# Patient Record
Sex: Female | Born: 1945 | Race: White | Hispanic: No | Marital: Married | Smoking: Former smoker
Health system: Southern US, Community
[De-identification: ages and names within clinical notes are randomized; demographics above are authoritative.]

## PROBLEM LIST (undated history)

## (undated) DIAGNOSIS — Z860100 Personal history of colon polyps, unspecified: Secondary | ICD-10-CM

## (undated) DIAGNOSIS — Z923 Personal history of irradiation: Secondary | ICD-10-CM

## (undated) DIAGNOSIS — E079 Disorder of thyroid, unspecified: Secondary | ICD-10-CM

## (undated) DIAGNOSIS — T7840XA Allergy, unspecified, initial encounter: Secondary | ICD-10-CM

## (undated) DIAGNOSIS — Z1211 Encounter for screening for malignant neoplasm of colon: Secondary | ICD-10-CM

## (undated) DIAGNOSIS — Z87891 Personal history of nicotine dependence: Secondary | ICD-10-CM

## (undated) DIAGNOSIS — K6289 Other specified diseases of anus and rectum: Secondary | ICD-10-CM

## (undated) DIAGNOSIS — E039 Hypothyroidism, unspecified: Secondary | ICD-10-CM

## (undated) DIAGNOSIS — C801 Malignant (primary) neoplasm, unspecified: Secondary | ICD-10-CM

## (undated) DIAGNOSIS — K219 Gastro-esophageal reflux disease without esophagitis: Secondary | ICD-10-CM

## (undated) DIAGNOSIS — I10 Essential (primary) hypertension: Secondary | ICD-10-CM

## (undated) DIAGNOSIS — B029 Zoster without complications: Secondary | ICD-10-CM

## (undated) DIAGNOSIS — F419 Anxiety disorder, unspecified: Secondary | ICD-10-CM

## (undated) DIAGNOSIS — E785 Hyperlipidemia, unspecified: Secondary | ICD-10-CM

## (undated) HISTORY — DX: Allergy, unspecified, initial encounter: T78.40XA

## (undated) HISTORY — DX: Other specified diseases of anus and rectum: K62.89

## (undated) HISTORY — DX: Essential (primary) hypertension: I10

## (undated) HISTORY — DX: Hyperlipidemia, unspecified: E78.5

## (undated) HISTORY — DX: Personal history of nicotine dependence: Z87.891

## (undated) HISTORY — DX: Disorder of thyroid, unspecified: E07.9

## (undated) HISTORY — DX: Encounter for screening for malignant neoplasm of colon: Z12.11

## (undated) HISTORY — DX: Zoster without complications: B02.9

## (undated) HISTORY — DX: Personal history of colonic polyps: Z86.010

## (undated) HISTORY — DX: Malignant (primary) neoplasm, unspecified: C80.1

## (undated) HISTORY — PX: TUBAL LIGATION: SHX77

## (undated) HISTORY — DX: Personal history of colon polyps, unspecified: Z86.0100

## (undated) HISTORY — PX: CATARACT EXTRACTION: SUR2

## (undated) HISTORY — PX: BREAST RECONSTRUCTION: SHX9

---

## 1985-06-07 DIAGNOSIS — C50919 Malignant neoplasm of unspecified site of unspecified female breast: Secondary | ICD-10-CM

## 1985-06-07 DIAGNOSIS — C801 Malignant (primary) neoplasm, unspecified: Secondary | ICD-10-CM

## 1985-06-07 HISTORY — DX: Malignant (primary) neoplasm, unspecified: C80.1

## 1985-06-07 HISTORY — PX: MASTECTOMY: SHX3

## 1985-06-07 HISTORY — PX: BREAST SURGERY: SHX581

## 1985-06-07 HISTORY — DX: Malignant neoplasm of unspecified site of unspecified female breast: C50.919

## 1986-06-07 HISTORY — PX: BREAST BIOPSY: SHX20

## 1986-06-07 HISTORY — PX: BREAST EXCISIONAL BIOPSY: SUR124

## 2003-06-08 DIAGNOSIS — E079 Disorder of thyroid, unspecified: Secondary | ICD-10-CM

## 2003-06-08 HISTORY — DX: Disorder of thyroid, unspecified: E07.9

## 2004-04-08 ENCOUNTER — Ambulatory Visit: Payer: Self-pay | Admitting: General Surgery

## 2005-04-14 ENCOUNTER — Ambulatory Visit: Payer: Self-pay | Admitting: General Surgery

## 2006-04-08 ENCOUNTER — Ambulatory Visit: Payer: Self-pay | Admitting: General Surgery

## 2006-06-07 DIAGNOSIS — K6289 Other specified diseases of anus and rectum: Secondary | ICD-10-CM

## 2006-06-07 HISTORY — DX: Other specified diseases of anus and rectum: K62.89

## 2007-04-14 ENCOUNTER — Ambulatory Visit: Payer: Self-pay | Admitting: General Surgery

## 2007-05-22 DIAGNOSIS — K6289 Other specified diseases of anus and rectum: Secondary | ICD-10-CM | POA: Insufficient documentation

## 2007-06-07 ENCOUNTER — Ambulatory Visit: Payer: Self-pay | Admitting: General Surgery

## 2008-04-10 ENCOUNTER — Ambulatory Visit: Payer: Self-pay | Admitting: General Surgery

## 2009-04-11 ENCOUNTER — Ambulatory Visit: Payer: Self-pay | Admitting: General Surgery

## 2010-04-13 ENCOUNTER — Ambulatory Visit: Payer: Self-pay | Admitting: General Surgery

## 2010-06-07 DIAGNOSIS — B029 Zoster without complications: Secondary | ICD-10-CM

## 2010-06-07 HISTORY — PX: COLONOSCOPY: SHX174

## 2010-06-07 HISTORY — DX: Zoster without complications: B02.9

## 2011-04-15 ENCOUNTER — Ambulatory Visit: Payer: Self-pay | Admitting: General Surgery

## 2011-10-13 ENCOUNTER — Ambulatory Visit: Payer: Self-pay | Admitting: General Surgery

## 2012-05-01 ENCOUNTER — Ambulatory Visit: Payer: Self-pay | Admitting: General Surgery

## 2012-11-27 ENCOUNTER — Encounter: Payer: Self-pay | Admitting: *Deleted

## 2012-11-27 DIAGNOSIS — E079 Disorder of thyroid, unspecified: Secondary | ICD-10-CM | POA: Insufficient documentation

## 2012-11-27 DIAGNOSIS — C801 Malignant (primary) neoplasm, unspecified: Secondary | ICD-10-CM | POA: Insufficient documentation

## 2013-05-10 ENCOUNTER — Ambulatory Visit: Payer: Self-pay | Admitting: General Surgery

## 2013-05-11 ENCOUNTER — Encounter: Payer: Self-pay | Admitting: General Surgery

## 2013-05-21 ENCOUNTER — Encounter: Payer: Self-pay | Admitting: General Surgery

## 2013-05-21 ENCOUNTER — Ambulatory Visit (INDEPENDENT_AMBULATORY_CARE_PROVIDER_SITE_OTHER): Payer: Medicare Other | Admitting: General Surgery

## 2013-05-21 VITALS — BP 150/70 | HR 90 | Resp 16 | Ht 61.0 in | Wt 154.0 lb

## 2013-05-21 DIAGNOSIS — Z853 Personal history of malignant neoplasm of breast: Secondary | ICD-10-CM

## 2013-05-21 DIAGNOSIS — K6289 Other specified diseases of anus and rectum: Secondary | ICD-10-CM

## 2013-05-21 DIAGNOSIS — Z1239 Encounter for other screening for malignant neoplasm of breast: Secondary | ICD-10-CM

## 2013-05-21 NOTE — Patient Instructions (Signed)
Patient to return in one year right screening  mammogram

## 2013-05-21 NOTE — Progress Notes (Signed)
Patient ID: Erin Nolan, female   DOB: Feb 21, 1946, 67 y.o.   MRN: [id number]  Chief Complaint  Patient presents with  . Follow-up    mammogram    HPI Erin Nolan is a 67 y.o. female who presents for a breast evaluation. The most recent right breast  mammogram was done on 05/09/13. Patient does perform regular self breast checks and gets regular mammograms done.    HPI  Past Medical History  Diagnosis Date  . Allergy   . Hypertension   . Personal history of tobacco use, presenting hazards to health   . Thyroid disease 2005    hypothyroidism  . Shingles 2012  . Special screening for malignant neoplasms, colon   . Proctitis 2008  . Hyperlipidemia   . Personal history of colonic polyps   . Cancer 1987    left, diagnosed in July 1987    Past Surgical History  Procedure Laterality Date  . Colonoscopy  2012    hx of colon polyps, Dr. Lemar Livings  . Tubal ligation    . Breast surgery Left 1987    mastectomy    Family History  Problem Relation Age of Onset  . Cancer Other     family member with breast cancer    Social History History  Substance Use Topics  . Smoking status: Former Smoker -- 1.00 packs/day for 20 years    Types: Cigarettes  . Smokeless tobacco: Never Used  . Alcohol Use: No    Allergies  Allergen Reactions  . Neosporin [Neomycin-Bacitracin Zn-Polymyx] Itching    Current Outpatient Prescriptions  Medication Sig Dispense Refill  . amLODipine (NORVASC) 10 MG tablet Take 1 tablet by mouth daily.      Marland Kitchen aspirin 81 MG tablet Take 81 mg by mouth daily.      . calcium carbonate (OS-CAL) 600 MG TABS tablet Take 600 mg by mouth 2 (two) times daily with a meal.      . cholecalciferol (VITAMIN D) 400 UNITS TABS tablet Take 400 Units by mouth.      Marland Kitchen KLOR-CON M20 20 MEQ tablet Take 1 tablet by mouth daily.      Marland Kitchen levothyroxine (SYNTHROID, LEVOTHROID) 50 MCG tablet Take 1 tablet by mouth daily at 6 (six) AM.      . pantoprazole (PROTONIX) 40 MG tablet  Take 1 tablet by mouth daily.      . simvastatin (ZOCOR) 20 MG tablet Take 1 tablet by mouth daily.       No current facility-administered medications for this visit.    Review of Systems Review of Systems  Constitutional: Negative.   Respiratory: Negative.   Cardiovascular: Negative.     Blood pressure 150/70, pulse 90, resp. rate 16, height 5\' 1"  (1.549 m), weight 154 lb (69.854 kg).  Physical Exam Physical Exam  Constitutional: She is oriented to person, place, and time. She appears well-developed and well-nourished.  Eyes: No scleral icterus.  Cardiovascular: Normal rate, regular rhythm and normal heart sounds.   Pulmonary/Chest: Breath sounds normal. Right breast exhibits no inverted nipple, no mass, no nipple discharge, no skin change and no tenderness. Breasts are asymmetrical (mild asymmetry secondary due reconstruction, mild capsular contracture on the left.).  Left reconstructionbreast well built.  Lymphadenopathy:    She has no cervical adenopathy.    She has no axillary adenopathy.  Neurological: She is alert and oriented to person, place, and time.  Skin: Skin is warm and dry.    Data Reviewed Right breast  mammogram dated May 10, 2013 was reviewed. No interval change. BI-RAD-1.  Assessment    Benign right breast exam.  No recurrent proctitis.     Plan    The patient desires annual follow up in this office. Repeat examination with a right breast mammogram will be obtained in one year.        Erin Nolan 05/21/2013, 7:50 PM

## 2013-10-26 ENCOUNTER — Ambulatory Visit: Payer: Self-pay | Admitting: Gastroenterology

## 2014-04-08 ENCOUNTER — Encounter: Payer: Self-pay | Admitting: General Surgery

## 2014-05-13 ENCOUNTER — Encounter: Payer: Self-pay | Admitting: General Surgery

## 2014-05-13 ENCOUNTER — Ambulatory Visit: Payer: Self-pay | Admitting: General Surgery

## 2014-05-23 ENCOUNTER — Ambulatory Visit (INDEPENDENT_AMBULATORY_CARE_PROVIDER_SITE_OTHER): Payer: Medicare Other | Admitting: General Surgery

## 2014-05-23 ENCOUNTER — Encounter: Payer: Self-pay | Admitting: General Surgery

## 2014-05-23 VITALS — BP 122/68 | HR 70 | Resp 12 | Ht 62.0 in | Wt 144.0 lb

## 2014-05-23 DIAGNOSIS — Z853 Personal history of malignant neoplasm of breast: Secondary | ICD-10-CM

## 2014-05-23 DIAGNOSIS — Z8719 Personal history of other diseases of the digestive system: Secondary | ICD-10-CM

## 2014-05-23 NOTE — Patient Instructions (Signed)
Patient will be asked to return to the office in one year with a right screening mammogram. 

## 2014-05-23 NOTE — Progress Notes (Signed)
Patient ID: Erin Nolan, female   DOB: Nov 18, 1945, 68 y.o.   MRN: [id number]  Chief Complaint  Patient presents with  . Follow-up    mammogram     HPI Erin Nolan is a 68 y.o. female who presents for a breast evaluation. The most recent mammogram was done on 05/13/14. Patient does perform regular self breast checks and gets regular mammograms done. She denies any problems at this time.   The patient reports no rectal bleeding.     HPI  Past Medical History  Diagnosis Date  . Allergy   . Hypertension   . Personal history of tobacco use, presenting hazards to health   . Thyroid disease 2005    hypothyroidism  . Shingles 2012  . Special screening for malignant neoplasms, colon   . Proctitis 2008  . Hyperlipidemia   . Personal history of colonic polyps   . Cancer 1987    left, diagnosed in July 1987    Past Surgical History  Procedure Laterality Date  . Colonoscopy  2012    hx of colon polyps, Dr. Bary Castilla  . Tubal ligation    . Breast surgery Left 1987    mastectomy  . Breast reconstruction Left     Family History  Problem Relation Age of Onset  . Cancer Other     family member with breast cancer    Social History History  Substance Use Topics  . Smoking status: Former Smoker -- 1.00 packs/day for 20 years    Types: Cigarettes  . Smokeless tobacco: Never Used  . Alcohol Use: No    Allergies  Allergen Reactions  . Neosporin [Neomycin-Bacitracin Zn-Polymyx] Itching    Current Outpatient Prescriptions  Medication Sig Dispense Refill  . amLODipine (NORVASC) 10 MG tablet Take 1 tablet by mouth daily.    Marland Kitchen aspirin 81 MG tablet Take 81 mg by mouth daily.    . calcium carbonate (OS-CAL) 600 MG TABS tablet Take 600 mg by mouth 2 (two) times daily with a meal.    . KLOR-CON M20 20 MEQ tablet Take 1 tablet by mouth daily.    Marland Kitchen levothyroxine (SYNTHROID, LEVOTHROID) 50 MCG tablet Take 1 tablet by mouth daily at 6 (six) AM.    . pantoprazole (PROTONIX) 40  MG tablet Take 1 tablet by mouth daily.    . simvastatin (ZOCOR) 20 MG tablet Take 1 tablet by mouth daily.    Marland Kitchen triamterene-hydrochlorothiazide (MAXZIDE) 75-50 MG per tablet     . cholecalciferol (VITAMIN D) 400 UNITS TABS tablet Take 400 Units by mouth.     No current facility-administered medications for this visit.    Review of Systems Review of Systems  Constitutional: Negative.   Respiratory: Negative.   Cardiovascular: Negative.     Blood pressure 122/68, pulse 70, resp. rate 12, height 5\' 2"  (1.575 m), weight 144 lb (65.318 kg).  Physical Exam Physical Exam  Constitutional: She appears well-developed and well-nourished.  Eyes: Conjunctivae are normal. No scleral icterus.  Neck: Neck supple.  Cardiovascular: Normal rate, regular rhythm and normal heart sounds.   Pulmonary/Chest: Effort normal and breath sounds normal. Right breast exhibits no inverted nipple, no mass, no nipple discharge, no skin change and no tenderness.  left breast reconstruction is good   Abdominal: Soft. Bowel sounds are normal. There is no tenderness.  Lymphadenopathy:    She has no cervical adenopathy.    She has no axillary adenopathy.  Neurological: She is alert.  Skin: Skin is warm  and dry.    Data Reviewed Right breast tomo synthesis mammogram dated 05/13/2014 was negative. BI-RADS-1.  Assessment    Benign breast exam. No clinical history to suggest recurrent proctitis.    Plan    Patient will be asked to return to the office in one year with a right  screening mammogram.    PCP:  Erin Nolan 05/25/2014, 8:08 AM

## 2014-05-25 DIAGNOSIS — Z853 Personal history of malignant neoplasm of breast: Secondary | ICD-10-CM | POA: Insufficient documentation

## 2014-05-25 DIAGNOSIS — Z8719 Personal history of other diseases of the digestive system: Secondary | ICD-10-CM | POA: Insufficient documentation

## 2015-03-28 ENCOUNTER — Other Ambulatory Visit: Payer: Self-pay

## 2015-03-28 DIAGNOSIS — Z1231 Encounter for screening mammogram for malignant neoplasm of breast: Secondary | ICD-10-CM

## 2015-05-15 ENCOUNTER — Ambulatory Visit
Admission: RE | Admit: 2015-05-15 | Discharge: 2015-05-15 | Disposition: A | Discharge status: Home / Self Care | Payer: Medicare Other | Source: Ambulatory Visit | Attending: General Surgery | Admitting: General Surgery

## 2015-05-15 ENCOUNTER — Other Ambulatory Visit: Payer: Self-pay | Admitting: General Surgery

## 2015-05-15 DIAGNOSIS — Z1231 Encounter for screening mammogram for malignant neoplasm of breast: Secondary | ICD-10-CM | POA: Diagnosis not present

## 2015-05-27 ENCOUNTER — Encounter: Payer: Self-pay | Admitting: General Surgery

## 2015-05-27 ENCOUNTER — Ambulatory Visit (INDEPENDENT_AMBULATORY_CARE_PROVIDER_SITE_OTHER): Payer: Medicare Other | Admitting: General Surgery

## 2015-05-27 VITALS — BP 142/64 | HR 86 | Resp 14 | Ht 62.0 in | Wt 150.0 lb

## 2015-05-27 DIAGNOSIS — Z853 Personal history of malignant neoplasm of breast: Secondary | ICD-10-CM

## 2015-05-27 NOTE — Progress Notes (Signed)
Patient ID: [name], female   DOB: 1946-03-16, 69 y.o.   MRN: [id number]  Chief Complaint  Patient presents with  . Follow-up    mammogram    HPI Erin Nolan is a 69 y.o. female who presents for her follow up breast cancer and a breast evaluation. The most recent mammogram was done on 05/15/15.Marland Kitchen  Patient does perform regular self breast checks and gets regular mammograms done.   No new breast issues. Denies rectal bleeding or pain. Occasional constipation which resolves with the ingestion of 1-2 prunes. I personally reviewed the patient's history. HPI  Past Medical History  Diagnosis Date  . Allergy   . Hypertension   . Personal history of tobacco use, presenting hazards to health   . Thyroid disease 2005    hypothyroidism  . Shingles 2012  . Special screening for malignant neoplasms, colon   . Proctitis 2008  . Hyperlipidemia   . Personal history of colonic polyps   . Cancer [facility]) 1987    left, diagnosed in July 1987    Past Surgical History  Procedure Laterality Date  . Colonoscopy  2012    hx of colon polyps, Dr. Bary Castilla  . Tubal ligation    . Breast surgery Left 1987    mastectomy  . Breast reconstruction Left   . Mastectomy Left 1987    breast ca  . Breast biopsy Right 1987    excisional - neg    Family History  Problem Relation Age of Onset  . Cancer Other     family member with breast cancer  . Cancer Sister   . Cancer Sister 21    uterine    Social History Social History  Substance Use Topics  . Smoking status: Former Smoker -- 1.00 packs/day for 20 years    Types: Cigarettes  . Smokeless tobacco: Never Used  . Alcohol Use: No    Allergies  Allergen Reactions  . Neosporin [Neomycin-Bacitracin Zn-Polymyx] Itching    Current Outpatient Prescriptions  Medication Sig Dispense Refill  . citalopram (CELEXA) 10 MG tablet Take 10 mg by mouth daily.    Marland Kitchen glucosamine-chondroitin 500-400 MG tablet Take 1 tablet by mouth 3 (three) times  daily.    . Multiple Vitamin (MULTIVITAMIN) capsule Take 1 capsule by mouth daily.    Marland Kitchen amLODipine (NORVASC) 10 MG tablet Take 1 tablet by mouth daily.    Marland Kitchen aspirin 81 MG tablet Take 81 mg by mouth daily.    . calcium carbonate (OS-CAL) 600 MG TABS tablet Take 600 mg by mouth 2 (two) times daily with a meal.    . cholecalciferol (VITAMIN D) 400 UNITS TABS tablet Take 400 Units by mouth.    Marland Kitchen KLOR-CON M20 20 MEQ tablet Take 1 tablet by mouth daily.    Marland Kitchen levothyroxine (SYNTHROID, LEVOTHROID) 50 MCG tablet Take 1 tablet by mouth daily at 6 (six) AM.    . pantoprazole (PROTONIX) 40 MG tablet Take 1 tablet by mouth daily.    . simvastatin (ZOCOR) 20 MG tablet Take 1 tablet by mouth daily.    Marland Kitchen triamterene-hydrochlorothiazide (MAXZIDE) 75-50 MG per tablet      No current facility-administered medications for this visit.    Review of Systems Review of Systems  Constitutional: Negative.   Respiratory: Negative.   Cardiovascular: Negative.   Gastrointestinal: Positive for constipation.    Blood pressure 142/64, pulse 86, resp. rate 14, height 5\' 2"  (1.575 m), weight 150 lb (68.04 kg).  Physical Exam  Physical Exam  Constitutional: She is oriented to person, place, and time. She appears well-developed and well-nourished.  HENT:  Mouth/Throat: Oropharynx is clear and moist.  Eyes: Conjunctivae are normal. No scleral icterus.  Neck: Neck supple.  Cardiovascular: Normal rate, regular rhythm and normal heart sounds.   Pulmonary/Chest: Effort normal and breath sounds normal. Right breast exhibits no inverted nipple, no mass, no nipple discharge, no skin change and no tenderness. Left breast exhibits no inverted nipple, no mass, no nipple discharge, no skin change and no tenderness.    Left breast reconstruction is stable.  Lymphadenopathy:    She has no cervical adenopathy.    She has no axillary adenopathy.  Neurological: She is alert and oriented to person, place, and time.  Skin: Skin is  warm and dry.  Psychiatric: Her behavior is normal.    Data Reviewed Screening right breast mammogram dated 05/15/2015 showed no interval change. BI-RADS-1.  The patient denies any recurrent rectal bleeding.    Assessment    Benign breast exam. No evidence of recurrent proctitis.    Plan         Patient to return in one year with screening mammogram.  This information has been scribed by Karie Fetch RNBC.   PCP:  Virgie Dad 05/28/2015, 4:24 PM

## 2015-05-27 NOTE — Patient Instructions (Addendum)
The patient is aware to call back for any questions or concerns. Continue self breast exams. Call office for any new breast issues or concerns. Patient to return in one year with screening mammogram.

## 2016-03-23 ENCOUNTER — Other Ambulatory Visit: Payer: Self-pay

## 2016-03-23 DIAGNOSIS — Z1231 Encounter for screening mammogram for malignant neoplasm of breast: Secondary | ICD-10-CM

## 2016-05-17 ENCOUNTER — Ambulatory Visit
Admission: RE | Admit: 2016-05-17 | Discharge: 2016-05-17 | Disposition: A | Discharge status: Home / Self Care | Payer: Medicare Other | Source: Ambulatory Visit | Attending: General Surgery | Admitting: General Surgery

## 2016-05-17 DIAGNOSIS — Z1231 Encounter for screening mammogram for malignant neoplasm of breast: Secondary | ICD-10-CM | POA: Insufficient documentation

## 2016-05-17 DIAGNOSIS — Z9012 Acquired absence of left breast and nipple: Secondary | ICD-10-CM | POA: Diagnosis not present

## 2016-05-17 DIAGNOSIS — Z853 Personal history of malignant neoplasm of breast: Secondary | ICD-10-CM | POA: Diagnosis not present

## 2016-05-20 ENCOUNTER — Ambulatory Visit (INDEPENDENT_AMBULATORY_CARE_PROVIDER_SITE_OTHER): Payer: Medicare Other | Admitting: General Surgery

## 2016-05-20 ENCOUNTER — Encounter: Payer: Self-pay | Admitting: General Surgery

## 2016-05-20 VITALS — BP 120/76 | HR 74 | Resp 12 | Ht 61.0 in | Wt 162.0 lb

## 2016-05-20 DIAGNOSIS — Z853 Personal history of malignant neoplasm of breast: Secondary | ICD-10-CM | POA: Diagnosis not present

## 2016-05-20 DIAGNOSIS — Z86010 Personal history of colonic polyps: Secondary | ICD-10-CM | POA: Diagnosis not present

## 2016-05-20 NOTE — Patient Instructions (Addendum)
The patient has been asked to return to the office in one year with a right breast screening mammogram. 

## 2016-05-20 NOTE — Progress Notes (Addendum)
Patient ID: Erin Nolan, female   DOB: [birth year], 70 y.o.   MRN: [id number]  Chief Complaint  Patient presents with  . Follow-up    mammogram    HPI Erin Nolan is a 70 y.o. female who presents for a breast evaluation. The most recent mammogram was done on 05/17/2016.  Patient does perform regular self breast checks and gets regular mammograms done.    HPI  Past Medical History:  Diagnosis Date  . Allergy   . Cancer ([place]) 12/13/1985   LCIS, simple mastectomy, 3 cm area of LCIS, immediate reconstruction.  . Hyperlipidemia   . Hypertension   . Personal history of colonic polyps    2013: Sessile adenoma of ascending colon, Tubular adenoma at 25 cm; 2008: Adenomatous polyp at 30 cm and rectum.1994: Adenomatous polyp at 30 cm;  1992: Adenomatous polyp at 25 cm  . Personal history of tobacco use, presenting hazards to health   . Proctitis 2008  . Shingles 2012  . Special screening for malignant neoplasms, colon   . Thyroid disease 2005   hypothyroidism    Past Surgical History:  Procedure Laterality Date  . BREAST BIOPSY Right 1988   Fibroadenoma  . BREAST RECONSTRUCTION Left   . BREAST SURGERY Left 1987   mastectomy  . COLONOSCOPY  2012   hx of colon polyps, Dr. Bary Castilla  . MASTECTOMY Left 1987   breast ca  . TUBAL LIGATION      Family History  Problem Relation Age of Onset  . Cancer Other     family member with breast cancer  . Cancer Sister   . Breast cancer Sister   . Cancer Sister 73    uterine  . Cancer Sister     colon cancer    Social History Social History  Substance Use Topics  . Smoking status: Former Smoker    Packs/day: 1.00    Years: 20.00    Types: Cigarettes  . Smokeless tobacco: Never Used  . Alcohol use No    Allergies  Allergen Reactions  . Neosporin [Neomycin-Bacitracin Zn-Polymyx] Itching    Current Outpatient Prescriptions  Medication Sig Dispense Refill  . amLODipine (NORVASC) 10 MG tablet Take 1 tablet by mouth  daily.    Marland Kitchen aspirin 81 MG tablet Take 81 mg by mouth daily.    . calcium carbonate (OS-CAL) 600 MG TABS tablet Take 600 mg by mouth 2 (two) times daily with a meal.    . cholecalciferol (VITAMIN D) 400 UNITS TABS tablet Take 400 Units by mouth.    . citalopram (CELEXA) 10 MG tablet Take 10 mg by mouth daily.    Marland Kitchen KLOR-CON M20 20 MEQ tablet Take 1 tablet by mouth daily.    Marland Kitchen levothyroxine (SYNTHROID, LEVOTHROID) 50 MCG tablet Take 1 tablet by mouth daily at 6 (six) AM.    . Multiple Vitamin (MULTIVITAMIN) capsule Take 1 capsule by mouth daily.    . pantoprazole (PROTONIX) 40 MG tablet Take 1 tablet by mouth daily.    . simvastatin (ZOCOR) 20 MG tablet Take 1 tablet by mouth daily.    Marland Kitchen triamterene-hydrochlorothiazide (MAXZIDE) 75-50 MG per tablet      No current facility-administered medications for this visit.     Review of Systems Review of Systems  Constitutional: Negative.   Respiratory: Negative.   Cardiovascular: Negative.   Gastrointestinal: Positive for constipation. Negative for anal bleeding, blood in stool and rectal pain.    Blood pressure 120/76, pulse 74, resp. rate  12, height 5\' 1"  (1.549 m), weight 162 lb (73.5 kg).  Physical Exam Physical Exam  Constitutional: She is oriented to person, place, and time. She appears well-developed and well-nourished.  Eyes: Conjunctivae are normal. No scleral icterus.  Neck: Neck supple.  Cardiovascular: Normal rate and regular rhythm.   Murmur heard.  Systolic murmur is present with a grade of 2/6  Pulmonary/Chest: Effort normal and breath sounds normal. Right breast exhibits no inverted nipple, no mass, no nipple discharge, no skin change and no tenderness. Left breast exhibits no inverted nipple, no mass, no nipple discharge, no skin change and no tenderness.    Abdominal: Soft. Bowel sounds are normal. There is no tenderness.  Lymphadenopathy:    She has no cervical adenopathy.    She has no axillary adenopathy.   Neurological: She is alert and oriented to person, place, and time.  Skin: Skin is warm and dry.    Data Reviewed 05/17/2016 right breast screening mammogram reviewed. Very dense for age. BI-RADS-1.  Assessment    Benign breast exam.    Plan    Candidate for follow-up colonoscopy spring 2018.     The patient has been asked to return to the office in one year with a right breast  screening mammogram.  This information has been scribed by Gaspar Cola CMA.  Robert Bellow 05/22/2016, 11:36 AM

## 2016-05-21 DIAGNOSIS — Z86010 Personal history of colonic polyps: Secondary | ICD-10-CM | POA: Insufficient documentation

## 2016-05-22 ENCOUNTER — Encounter: Payer: Self-pay | Admitting: General Surgery

## 2016-06-04 ENCOUNTER — Encounter: Payer: Self-pay | Admitting: General Surgery

## 2016-07-13 ENCOUNTER — Encounter: Payer: Self-pay | Admitting: General Surgery

## 2016-07-14 ENCOUNTER — Encounter: Payer: Self-pay | Admitting: General Surgery

## 2016-10-19 ENCOUNTER — Encounter: Payer: Self-pay | Admitting: General Surgery

## 2016-10-21 ENCOUNTER — Ambulatory Visit (INDEPENDENT_AMBULATORY_CARE_PROVIDER_SITE_OTHER): Payer: Medicare Other | Admitting: General Surgery

## 2016-10-21 ENCOUNTER — Encounter: Payer: Self-pay | Admitting: General Surgery

## 2016-10-21 VITALS — BP 146/82 | HR 86 | Resp 12 | Ht 61.0 in | Wt 159.0 lb

## 2016-10-21 DIAGNOSIS — Z86010 Personal history of colonic polyps: Secondary | ICD-10-CM

## 2016-10-21 MED ORDER — POLYETHYLENE GLYCOL 3350 17 GM/SCOOP PO POWD: ORAL | 0 refills | Status: DC

## 2016-10-21 NOTE — Patient Instructions (Signed)
Colonoscopy, Adult A colonoscopy is an exam to look at the entire large intestine. During the exam, a lubricated, bendable tube is inserted into the anus and then passed into the rectum, colon, and other parts of the large intestine. A colonoscopy is often done as a part of normal colorectal screening or in response to certain symptoms, such as anemia, persistent diarrhea, abdominal pain, and blood in the stool. The exam can help screen for and diagnose medical problems, including:  Tumors.  Polyps.  Inflammation.  Areas of bleeding. Tell a health care provider about:  Any allergies you have.  All medicines you are taking, including vitamins, herbs, eye drops, creams, and over-the-counter medicines.  Any problems you or family members have had with anesthetic medicines.  Any blood disorders you have.  Any surgeries you have had.  Any medical conditions you have.  Any problems you have had passing stool. What are the risks? Generally, this is a safe procedure. However, problems may occur, including:  Bleeding.  A tear in the intestine.  A reaction to medicines given during the exam.  Infection (rare). What happens before the procedure? Eating and drinking restrictions  Follow instructions from your health care provider about eating and drinking, which may include:  A few days before the procedure - follow a low-fiber diet. Avoid nuts, seeds, dried fruit, raw fruits, and vegetables.  1-3 days before the procedure - follow a clear liquid diet. Drink only clear liquids, such as clear broth or bouillon, black coffee or tea, clear juice, clear soft drinks or sports drinks, gelatin dessert, and popsicles. Avoid any liquids that contain red or purple dye.  On the day of the procedure - do not eat or drink anything during the 2 hours before the procedure, or within the time period that your health care provider recommends. Bowel prep  If you were prescribed an oral bowel prep to  clean out your colon:  Take it as told by your health care provider. Starting the day before your procedure, you will need to drink a large amount of medicated liquid. The liquid will cause you to have multiple loose stools until your stool is almost clear or light green.  If your skin or anus gets irritated from diarrhea, you may use these to relieve the irritation:  Medicated wipes, such as adult wet wipes with aloe and vitamin E.  A skin soothing-product like petroleum jelly.  If you vomit while drinking the bowel prep, take a break for up to 60 minutes and then begin the bowel prep again. If vomiting continues and you cannot take the bowel prep without vomiting, call your health care provider. General instructions   Ask your health care provider about changing or stopping your regular medicines. This is especially important if you are taking diabetes medicines or blood thinners.  Plan to have someone take you home from the hospital or clinic. What happens during the procedure?  An IV tube may be inserted into one of your veins.  You will be given medicine to help you relax (sedative).  To reduce your risk of infection:  Your health care team will wash or sanitize their hands.  Your anal area will be washed with soap.  You will be asked to lie on your side with your knees bent.  Your health care provider will lubricate a long, thin, flexible tube. The tube will have a camera and a light on the end.  The tube will be inserted into your anus.    The tube will be gently eased through your rectum and colon.  Air will be delivered into your colon to keep it open. You may feel some pressure or cramping.  The camera will be used to take images during the procedure.  A small tissue sample may be removed from your body to be examined under a microscope (biopsy). If any potential problems are found, the tissue will be sent to a lab for testing.  If small polyps are found, your health  care provider may remove them and have them checked for cancer cells.  The tube that was inserted into your anus will be slowly removed. The procedure may vary among health care providers and hospitals. What happens after the procedure?  Your blood pressure, heart rate, breathing rate, and blood oxygen level will be monitored until the medicines you were given have worn off.  Do not drive for 24 hours after the exam.  You may have a small amount of blood in your stool.  You may pass gas and have mild abdominal cramping or bloating due to the air that was used to inflate your colon during the exam.  It is up to you to get the results of your procedure. Ask your health care provider, or the department performing the procedure, when your results will be ready. This information is not intended to replace advice given to you by your health care provider. Make sure you discuss any questions you have with your health care provider. Document Released: 05/21/2000 Document Revised: 03/24/2016 Document Reviewed: 08/05/2015 [employer] Interactive Patient Education  2017 [employer]  

## 2016-10-21 NOTE — Progress Notes (Signed)
Patient ID: Erin Nolan, female   DOB: [birth year], 71 y.o.   MRN: [id number]  Chief Complaint  Patient presents with  . Colonoscopy    HPI Erin Nolan is a 71 y.o. female is here today for a 5 year colonoscopy follow up. She states no GI issues. Denies bleeding and mucous. Moves her bowels regularly.  HPI  Past Medical History:  Diagnosis Date  . Allergy   . Cancer ([place]) 12/13/1985   LCIS, simple mastectomy, 3 cm area of LCIS, immediate reconstruction.  . Hyperlipidemia   . Hypertension   . Personal history of colonic polyps    2013: Sessile adenoma of ascending colon, Tubular adenoma at 25 cm; 2008: Adenomatous polyp at 30 cm and rectum.1994: Adenomatous polyp at 30 cm;  1992: Adenomatous polyp at 25 cm  . Personal history of tobacco use, presenting hazards to health   . Proctitis 2008  . Shingles 2012  . Special screening for malignant neoplasms, colon   . Thyroid disease 2005   hypothyroidism    Past Surgical History:  Procedure Laterality Date  . BREAST BIOPSY Right 1988   Fibroadenoma  . BREAST RECONSTRUCTION Left   . BREAST SURGERY Left 1987   mastectomy  . COLONOSCOPY  2012   hx of colon polyps, Dr. Bary Nolan  . MASTECTOMY Left 1987   breast ca  . TUBAL LIGATION      Family History  Problem Relation Age of Onset  . Cancer Other        family member with breast cancer  . Cancer Sister   . Breast cancer Sister   . Cancer Sister 98       uterine  . Cancer Sister        colon cancer    Social History Social History  Substance Use Topics  . Smoking status: Former Smoker    Packs/day: 1.00    Years: 20.00    Types: Cigarettes  . Smokeless tobacco: Never Used  . Alcohol use No    Allergies  Allergen Reactions  . Neosporin [Neomycin-Bacitracin Zn-Polymyx] Itching    Current Outpatient Prescriptions  Medication Sig Dispense Refill  . amLODipine (NORVASC) 10 MG tablet Take 1 tablet by mouth daily.    Marland Kitchen aspirin 81 MG tablet Take 81 mg by  mouth daily.    . calcium carbonate (OS-CAL) 600 MG TABS tablet Take 600 mg by mouth 2 (two) times daily with a meal.    . cholecalciferol (VITAMIN D) 400 UNITS TABS tablet Take 400 Units by mouth.    . citalopram (CELEXA) 10 MG tablet Take 10 mg by mouth daily.    Marland Kitchen KLOR-CON M20 20 MEQ tablet Take 1 tablet by mouth daily.    Marland Kitchen levothyroxine (SYNTHROID, LEVOTHROID) 50 MCG tablet Take 1 tablet by mouth daily at 6 (six) AM.    . Multiple Vitamin (MULTIVITAMIN) capsule Take 1 capsule by mouth daily.    . pantoprazole (PROTONIX) 40 MG tablet Take 1 tablet by mouth daily.    . simvastatin (ZOCOR) 20 MG tablet Take 1 tablet by mouth daily.    Marland Kitchen triamterene-hydrochlorothiazide (MAXZIDE) 75-50 MG per tablet     . polyethylene glycol powder (GLYCOLAX/MIRALAX) powder 255 grams one bottle for colonoscopy prep 255 g 0   No current facility-administered medications for this visit.     Review of Systems Review of Systems  Constitutional: Negative.   Respiratory: Negative.   Cardiovascular: Negative.   Gastrointestinal: Negative.     Blood pressure Marland Kitchen)  146/82, pulse 86, resp. rate 12, height 5\' 1"  (1.549 m), weight 159 lb (72.1 kg).  Physical Exam Physical Exam  Constitutional: She is oriented to person, place, and time. She appears well-developed and well-nourished.  Eyes: Conjunctivae are normal. No scleral icterus.  Cardiovascular: Normal rate, regular rhythm and normal heart sounds.   Pulmonary/Chest: Effort normal and breath sounds normal.  Neurological: She is alert and oriented to person, place, and time.  Skin: Skin is warm and dry.    Data Reviewed 10/13/2011 colonoscopy showed 2 lesions in the distal ascending colon, 2 in the rectosigmoid and one in the rectosigmoid area. The proximal lesion was thought to represent a sessile serrated adenoma without dysplasia. The distal lesions tubular adenoma.  Assessment    Candidate for follow-up colonoscopy.    Plan      Colonoscopy  with possible biopsy/polypectomy prn: Information regarding the procedure, including its potential risks and complications (including but not limited to perforation of the bowel, which may require emergency surgery to repair, and bleeding) was verbally given to the patient. Educational information regarding lower intestinal endoscopy was given to the patient. Written instructions for how to complete the bowel prep using Miralax were provided. The importance of drinking ample fluids to avoid dehydration as a result of the prep emphasized.  HPI, Physical Exam, Assessment and Plan have been scribed under the direction and in the presence of Erin Ard, MD.  Erin Nolan, CMA  I have completed the exam and reviewed the above documentation for accuracy and completeness.  I agree with the above.  Haematologist has been used and any errors in dictation or transcription are unintentional.  Erin Nolan, M.D., F.A.C.S.          Erin Nolan 10/23/2016, 8:14 AM  Patient has been scheduled for a colonoscopy on 11-24-16 at [facility]. It is okay for patient to continue 81 mg aspirin once daily. She will only take her blood pressure pill at 6 am with a small sip of water. Miralax prescription has been sent in to the patient's pharmacy today. Colonoscopy instructions have been reviewed with the patient. This patient is aware to call the office if they have further questions.   Erin Nolan, CMA

## 2016-11-17 ENCOUNTER — Encounter: Payer: Self-pay | Admitting: *Deleted

## 2016-11-24 ENCOUNTER — Ambulatory Visit: Payer: Medicare Other | Admitting: Certified Registered Nurse Anesthetist

## 2016-11-24 ENCOUNTER — Encounter
Admission: RE | Disposition: A | Discharge status: Home / Self Care | Payer: Self-pay | Source: Ambulatory Visit | Attending: General Surgery

## 2016-11-24 ENCOUNTER — Ambulatory Visit
Admission: RE | Admit: 2016-11-24 | Discharge: 2016-11-24 | Disposition: A | Discharge status: Home / Self Care | Payer: Medicare Other | Source: Ambulatory Visit | Attending: General Surgery | Admitting: General Surgery

## 2016-11-24 ENCOUNTER — Encounter: Payer: Self-pay | Admitting: *Deleted

## 2016-11-24 DIAGNOSIS — I10 Essential (primary) hypertension: Secondary | ICD-10-CM | POA: Insufficient documentation

## 2016-11-24 DIAGNOSIS — Z853 Personal history of malignant neoplasm of breast: Secondary | ICD-10-CM | POA: Diagnosis not present

## 2016-11-24 DIAGNOSIS — D128 Benign neoplasm of rectum: Secondary | ICD-10-CM | POA: Diagnosis not present

## 2016-11-24 DIAGNOSIS — K219 Gastro-esophageal reflux disease without esophagitis: Secondary | ICD-10-CM | POA: Insufficient documentation

## 2016-11-24 DIAGNOSIS — Z7982 Long term (current) use of aspirin: Secondary | ICD-10-CM | POA: Insufficient documentation

## 2016-11-24 DIAGNOSIS — Z86010 Personal history of colonic polyps: Secondary | ICD-10-CM | POA: Insufficient documentation

## 2016-11-24 DIAGNOSIS — E039 Hypothyroidism, unspecified: Secondary | ICD-10-CM | POA: Diagnosis not present

## 2016-11-24 DIAGNOSIS — Z79899 Other long term (current) drug therapy: Secondary | ICD-10-CM | POA: Diagnosis not present

## 2016-11-24 DIAGNOSIS — E785 Hyperlipidemia, unspecified: Secondary | ICD-10-CM | POA: Diagnosis not present

## 2016-11-24 DIAGNOSIS — Z87891 Personal history of nicotine dependence: Secondary | ICD-10-CM | POA: Diagnosis not present

## 2016-11-24 DIAGNOSIS — K621 Rectal polyp: Secondary | ICD-10-CM

## 2016-11-24 DIAGNOSIS — Z1211 Encounter for screening for malignant neoplasm of colon: Secondary | ICD-10-CM | POA: Insufficient documentation

## 2016-11-24 HISTORY — DX: Gastro-esophageal reflux disease without esophagitis: K21.9

## 2016-11-24 HISTORY — PX: COLONOSCOPY WITH PROPOFOL: SHX5780

## 2016-11-24 HISTORY — DX: Hypothyroidism, unspecified: E03.9

## 2016-11-24 SURGERY — COLONOSCOPY WITH PROPOFOL
Anesthesia: General

## 2016-11-24 MED ORDER — PROPOFOL 500 MG/50ML IV EMUL
INTRAVENOUS | Status: AC
  Filled 2016-11-24: qty 50

## 2016-11-24 MED ORDER — PROPOFOL 10 MG/ML IV BOLUS
INTRAVENOUS | Status: DC | PRN
  Administered 2016-11-24: 18 mg via INTRAVENOUS
  Administered 2016-11-24: 90 mg via INTRAVENOUS

## 2016-11-24 MED ORDER — PROPOFOL 500 MG/50ML IV EMUL
INTRAVENOUS | Status: DC | PRN
  Administered 2016-11-24: 110 ug/kg/min via INTRAVENOUS

## 2016-11-24 MED ORDER — SODIUM CHLORIDE 0.9 % IV SOLN
INTRAVENOUS | Status: DC
  Administered 2016-11-24: 1000 mL via INTRAVENOUS

## 2016-11-24 MED ORDER — LIDOCAINE HCL (PF) 2 % IJ SOLN
INTRAMUSCULAR | Status: DC | PRN
  Administered 2016-11-24: 50 mg via INTRADERMAL

## 2016-11-24 MED ORDER — PHENYLEPHRINE HCL 10 MG/ML IJ SOLN
INTRAMUSCULAR | Status: DC | PRN
  Administered 2016-11-24: 100 ug via INTRAVENOUS

## 2016-11-24 MED ORDER — LIDOCAINE HCL (PF) 2 % IJ SOLN
INTRAMUSCULAR | Status: AC
  Filled 2016-11-24: qty 2

## 2016-11-24 NOTE — Transfer of Care (Signed)
Immediate Anesthesia Transfer of Care Note  Patient: Erin Nolan  Procedure(s) Performed: Procedure(s): COLONOSCOPY WITH PROPOFOL (N/A)  Patient Location: PACU  Anesthesia Type:General  Level of Consciousness: sedated  Airway & Oxygen Therapy: Patient connected to nasal cannula oxygen  Post-op Assessment: Report given to RN and Post -op Vital signs reviewed and stable  Post vital signs: Reviewed and stable  Last Vitals:  Vitals:   11/24/16 1010  BP: 138/65  Pulse: 81  Resp: 20  Temp: 36.8 C    Last Pain:  Vitals:   11/24/16 1010  TempSrc: Tympanic         Complications: No apparent anesthesia complications

## 2016-11-24 NOTE — Op Note (Signed)
[facility] Patient Name: Erin Nolan Procedure Date: 11/24/2016 10:56 AM MRN: [id number] Account #: [id number] Date of Birth: 1945/07/31 Admit Type: Outpatient Age: 71 Room: [facility] ENDO ROOM 1 Gender: Female Note Status: Finalized Procedure:            Colonoscopy Indications:          High risk colon cancer surveillance: Personal history                        of colonic polyps Providers:            Robert Bellow, MD Referring MD:         Irven Easterly. Kary Kos, MD (Referring MD) Medicines:            Monitored Anesthesia Care Complications:        No immediate complications. Procedure:            Pre-Anesthesia Assessment:                       - Prior to the procedure, a History and Physical was                        performed, and patient medications, allergies and                        sensitivities were reviewed. The patient's tolerance of                        previous anesthesia was reviewed.                       - The risks and benefits of the procedure and the                        sedation options and risks were discussed with the                        patient. All questions were answered and informed                        consent was obtained.                       After obtaining informed consent, the colonoscope was                        passed under direct vision. Throughout the procedure,                        the patient's blood pressure, pulse, and oxygen                        saturations were monitored continuously. The                        Colonoscope was introduced through the anus and                        advanced to the the cecum, identified by appendiceal  orifice and ileocecal valve. The colonoscopy was                        somewhat difficult due to significant looping.                        Successful completion of the procedure was aided by                        using manual  pressure. The patient tolerated the                        procedure well. The quality of the bowel preparation                        was adequate to identify polyps. Findings:      Three sessile polyps were found in the rectum (benign-appearing lesion).       The polyps were 5 mm in size. These were biopsied with a cold forceps       for histology.      A 10 mm polyp was found in the rectum rectum. The polyp was       semi-sessile. The polyp was removed with a cold snare. Resection and       retrieval were complete.      The retroflexed view of the distal rectum and anal verge was normal and       showed no anal or rectal abnormalities. Impression:           - Three benign appearing 5 mm polyps in the rectum.                        Biopsied.                       - One 10 mm polyp in the rectum (adenomatous) in the                        rectum, removed with a cold snare. Resected and                        retrieved.                       - The distal rectum and anal verge are normal on                        retroflexion view. Recommendation:       - Telephone endoscopist for pathology results in 1 week. Procedure Code(s):    --- Professional ---                       [id number], Colonoscopy, flexible; with removal of tumor(s),                        polyp(s), or other lesion(s) by snare technique                       02542, 59, Colonoscopy, flexible; with biopsy, single  or multiple Diagnosis Code(s):    --- Professional ---                       D12.8, Benign neoplasm of rectum                       K62.1, Rectal polyp                       Z86.010, Personal history of colonic polyps CPT copyright 2016 American Medical Association. All rights reserved. The codes documented in this report are preliminary and upon coder review may  be revised to meet current compliance requirements. Robert Bellow, MD 11/24/2016 11:44:33 AM This report has been signed  electronically. Number of Addenda: 0 Note Initiated On: 11/24/2016 10:56 AM Scope Withdrawal Time: 0 hours 23 minutes 16 seconds  Total Procedure Duration: 0 hours 39 minutes 24 seconds       [facility]

## 2016-11-24 NOTE — Anesthesia Post-op Follow-up Note (Cosign Needed)
Anesthesia QCDR form completed.        

## 2016-11-24 NOTE — Anesthesia Postprocedure Evaluation (Signed)
Anesthesia Post Note  Patient: Erin Nolan  Procedure(s) Performed: Procedure(s) (LRB): COLONOSCOPY WITH PROPOFOL (N/A)  Patient location during evaluation: PACU Anesthesia Type: General Level of consciousness: awake Pain management: pain level controlled Vital Signs Assessment: post-procedure vital signs reviewed and stable Respiratory status: spontaneous breathing Cardiovascular status: stable Anesthetic complications: no     Last Vitals:  Vitals:   11/24/16 1200 11/24/16 1210  BP: 110/64 (!) 118/53  Pulse: 70 66  Resp: 15 15  Temp:      Last Pain:  Vitals:   11/24/16 1145  TempSrc: Tympanic                 VAN STAVEREN,Ellerie Arenz

## 2016-11-24 NOTE — H&P (Signed)
Erin Nolan [id number] 1946-05-18     HPI: 71 year old woman with past history of colonic polyps. Distal ascending colon in May 2013, rectosigmoid. Features consistent with a sessile adenoma as well as tubular adenomas. For repeat colonoscopy.  The patient reports that she tolerated her prep well.  Prescriptions Prior to Admission  Medication Sig Dispense Refill Last Dose  . amLODipine (NORVASC) 10 MG tablet Take 1 tablet by mouth daily.   11/24/2016 at 0600  . aspirin 81 MG tablet Take 81 mg by mouth daily.   Taking  . calcium carbonate (OS-CAL) 600 MG TABS tablet Take 600 mg by mouth 2 (two) times daily with a meal.   Taking  . cholecalciferol (VITAMIN D) 400 UNITS TABS tablet Take 400 Units by mouth.   Taking  . citalopram (CELEXA) 10 MG tablet Take 10 mg by mouth daily.   Taking  . KLOR-CON M20 20 MEQ tablet Take 1 tablet by mouth daily.   Taking  . levothyroxine (SYNTHROID, LEVOTHROID) 50 MCG tablet Take 1 tablet by mouth daily at 6 (six) AM.   Taking  . Multiple Vitamin (MULTIVITAMIN) capsule Take 1 capsule by mouth daily.   Taking  . pantoprazole (PROTONIX) 40 MG tablet Take 1 tablet by mouth daily.   Taking  . polyethylene glycol powder (GLYCOLAX/MIRALAX) powder 255 grams one bottle for colonoscopy prep 255 g 0   . simvastatin (ZOCOR) 20 MG tablet Take 1 tablet by mouth daily.   Taking  . triamterene-hydrochlorothiazide (MAXZIDE) 75-50 MG per tablet    Taking   Allergies  Allergen Reactions  . Neosporin [Neomycin-Bacitracin Zn-Polymyx] Itching   Past Medical History:  Diagnosis Date  . Allergy   . Cancer ([place]) 12/13/1985   LCIS, simple mastectomy, 3 cm area of LCIS, immediate reconstruction.  Marland Kitchen GERD (gastroesophageal reflux disease)   . Hyperlipidemia   . Hypertension   . Hypothyroidism   . Personal history of colonic polyps    2013: Sessile adenoma of ascending colon, Tubular adenoma at 25 cm; 2008: Adenomatous polyp at 30 cm and rectum.1994: Adenomatous polyp at 30  cm;  1992: Adenomatous polyp at 25 cm  . Personal history of tobacco use, presenting hazards to health   . Proctitis 2008  . Shingles 2012  . Special screening for malignant neoplasms, colon   . Thyroid disease 2005   hypothyroidism   Past Surgical History:  Procedure Laterality Date  . BREAST BIOPSY Right 1988   Fibroadenoma  . BREAST RECONSTRUCTION Left   . BREAST SURGERY Left 1987   mastectomy  . COLONOSCOPY  2012   hx of colon polyps, Dr. Bary Castilla  . MASTECTOMY Left 1987   breast ca  . TUBAL LIGATION     Social History   Social History  . Marital status: Married    Spouse name: N/A  . Number of children: N/A  . Years of education: N/A   Occupational History  . Not on file.   Social History Main Topics  . Smoking status: Former Smoker    Packs/day: 1.00    Years: 20.00    Types: Cigarettes  . Smokeless tobacco: Never Used  . Alcohol use No  . Drug use: No  . Sexual activity: Not on file   Other Topics Concern  . Not on file   Social History Narrative  . No narrative on file   Social History   Social History Narrative  . No narrative on file     ROS: Negative.  PE: HEENT: Negative. Lungs: Clear. Cardio: RR.  Assessment/Plan:  Proceed with planned endoscopy.    Robert Bellow 11/24/2016   Assessment/Plan:  Proceed with planned endoscopy.

## 2016-11-24 NOTE — Anesthesia Preprocedure Evaluation (Signed)
Anesthesia Evaluation  Patient identified by MRN, date of birth, ID band Patient awake    Reviewed: Allergy & Precautions, NPO status , Patient's Chart, lab work & pertinent test results  Airway Mallampati: III       Dental  (+) Upper Dentures, Lower Dentures   Pulmonary COPD, former smoker,     + decreased breath sounds      Cardiovascular Exercise Tolerance: Good hypertension, Pt. on medications  Rhythm:Regular     Neuro/Psych negative neurological ROS  negative psych ROS   GI/Hepatic Neg liver ROS, GERD  Medicated,  Endo/Other  Hypothyroidism   Renal/GU negative Renal ROS     Musculoskeletal negative musculoskeletal ROS (+)   Abdominal Normal abdominal exam  (+)   Peds negative pediatric ROS (+)  Hematology negative hematology ROS (+)   Anesthesia Other Findings   Reproductive/Obstetrics                             Anesthesia Physical Anesthesia Plan  ASA: II  Anesthesia Plan: General   Post-op Pain Management:    Induction: Intravenous  PONV Risk Score and Plan: 0  Airway Management Planned: Natural Airway and Nasal Cannula  Additional Equipment:   Intra-op Plan:   Post-operative Plan:   Informed Consent: I have reviewed the patients History and Physical, chart, labs and discussed the procedure including the risks, benefits and alternatives for the proposed anesthesia with the patient or authorized representative who has indicated his/her understanding and acceptance.     Plan Discussed with: CRNA  Anesthesia Plan Comments:         Anesthesia Quick Evaluation

## 2016-11-25 ENCOUNTER — Encounter: Payer: Self-pay | Admitting: General Surgery

## 2016-11-26 LAB — SURGICAL PATHOLOGY

## 2017-03-29 ENCOUNTER — Other Ambulatory Visit: Payer: Self-pay

## 2017-03-29 DIAGNOSIS — Z1231 Encounter for screening mammogram for malignant neoplasm of breast: Secondary | ICD-10-CM

## 2017-05-20 ENCOUNTER — Ambulatory Visit
Admission: RE | Admit: 2017-05-20 | Discharge: 2017-05-20 | Disposition: A | Discharge status: Home / Self Care | Payer: Medicare Other | Source: Ambulatory Visit | Attending: General Surgery | Admitting: General Surgery

## 2017-05-20 DIAGNOSIS — Z1231 Encounter for screening mammogram for malignant neoplasm of breast: Secondary | ICD-10-CM | POA: Insufficient documentation

## 2017-06-01 ENCOUNTER — Encounter: Payer: Self-pay | Admitting: General Surgery

## 2017-06-01 ENCOUNTER — Ambulatory Visit (INDEPENDENT_AMBULATORY_CARE_PROVIDER_SITE_OTHER): Payer: Medicare Other | Admitting: General Surgery

## 2017-06-01 VITALS — BP 132/64 | HR 88 | Resp 12 | Ht 61.0 in | Wt 165.0 lb

## 2017-06-01 DIAGNOSIS — Z853 Personal history of malignant neoplasm of breast: Secondary | ICD-10-CM

## 2017-06-01 DIAGNOSIS — M25842 Other specified joint disorders, left hand: Secondary | ICD-10-CM

## 2017-06-01 NOTE — Progress Notes (Signed)
Patient ID: [name], female   DOB: 06-06-1946, 71 y.o.   MRN: [id number]  Chief Complaint  Patient presents with  . Follow-up    HPI Erin Nolan is a 71 y.o. female.  who presents for her follow up LCIS left breast and a breast evaluation. The most recent right mammogram was done on 05-20-17.  Patient does perform regular self breast checks and gets regular mammograms done.   No new breast issues.  HPI  Past Medical History:  Diagnosis Date  . Allergy   . Cancer ([place]) 12/13/1985   LCIS, simple mastectomy, 3 cm area of LCIS, immediate reconstruction.  Marland Kitchen GERD (gastroesophageal reflux disease)   . Hyperlipidemia   . Hypertension   . Hypothyroidism   . Personal history of colonic polyps    2013: Sessile adenoma of ascending colon, Tubular adenoma at 25 cm; 2008: Adenomatous polyp at 30 cm and rectum.1994: Adenomatous polyp at 30 cm;  1992: Adenomatous polyp at 25 cm  . Personal history of tobacco use, presenting hazards to health   . Proctitis 2008  . Shingles 2012  . Special screening for malignant neoplasms, colon   . Thyroid disease 2005   hypothyroidism    Past Surgical History:  Procedure Laterality Date  . BREAST BIOPSY Right 1988   Fibroadenoma  . BREAST RECONSTRUCTION Left   . BREAST SURGERY Left 1987   mastectomy  . COLONOSCOPY  2012   hx of colon polyps, Dr. Bary Castilla  . COLONOSCOPY WITH PROPOFOL N/A 11/24/2016   Procedure: COLONOSCOPY WITH PROPOFOL;  Surgeon: Robert Bellow, MD;  Location: [facility] ENDOSCOPY;  Service: Endoscopy;  Laterality: N/A;  . MASTECTOMY Left 1987   breast ca  . TUBAL LIGATION      Family History  Problem Relation Age of Onset  . Cancer Other        family member with breast cancer  . Cancer Sister   . Breast cancer Sister   . Cancer Sister 48       uterine  . Cancer Sister        colon cancer    Social History Social History   Tobacco Use  . Smoking status: Former Smoker    Packs/day: 1.00    Years: 20.00    Pack years: 20.00    Types: Cigarettes  . Smokeless tobacco: Never Used  Substance Use Topics  . Alcohol use: No  . Drug use: No    Allergies  Allergen Reactions  . Neosporin [Neomycin-Bacitracin Zn-Polymyx] Itching    Current Outpatient Medications  Medication Sig Dispense Refill  . amLODipine (NORVASC) 10 MG tablet Take 1 tablet by mouth daily.    Marland Kitchen aspirin 81 MG tablet Take 81 mg by mouth daily.    . calcium carbonate (OS-CAL) 600 MG TABS tablet Take 600 mg by mouth every other day.     . cholecalciferol (VITAMIN D) 400 UNITS TABS tablet Take 400 Units by mouth.    . citalopram (CELEXA) 10 MG tablet Take 10 mg by mouth daily.    Marland Kitchen KLOR-CON M20 20 MEQ tablet Take 1 tablet by mouth daily.    Marland Kitchen levothyroxine (SYNTHROID, LEVOTHROID) 50 MCG tablet Take 1 tablet by mouth daily at 6 (six) AM.    . Multiple Vitamin (MULTIVITAMIN) capsule Take 1 capsule by mouth daily.    . pantoprazole (PROTONIX) 40 MG tablet Take 1 tablet by mouth daily.    . simvastatin (ZOCOR) 20 MG tablet Take 1 tablet by mouth daily.    Marland Kitchen  triamterene-hydrochlorothiazide (MAXZIDE) 75-50 MG per tablet      No current facility-administered medications for this visit.     Review of Systems Review of Systems  Constitutional: Negative.   Respiratory: Negative.   Cardiovascular: Negative.     Blood pressure 132/64, pulse 88, resp. rate 12, height 5\' 1"  (1.549 m), weight 165 lb (74.8 kg), SpO2 98 %.  Physical Exam Physical Exam  Constitutional: She is oriented to person, place, and time. She appears well-developed and well-nourished.  HENT:  Mouth/Throat: Oropharynx is clear and moist.  Eyes: Conjunctivae are normal. No scleral icterus.  Neck: Neck supple.  Cardiovascular: Normal rate, regular rhythm and normal heart sounds.  Pulmonary/Chest: Effort normal and breath sounds normal. Right breast exhibits no inverted nipple, no mass, no nipple discharge, no skin change and no tenderness.  Left mastectomy scar  clean  Musculoskeletal:       Arms: Lymphadenopathy:    She has no cervical adenopathy.    She has no axillary adenopathy.  Neurological: She is alert and oriented to person, place, and time.  Skin: Skin is warm and dry.  Non tender 5 mm raised area right middle finger at nail base  Psychiatric: Her behavior is normal.     Data Reviewed .May 20, 2017 right breast mammogram was reviewed.  BI-RADS-1.  Still with a remarkable amount of parenchymal tissue considering her age.  A photo of the left hand lesion was shown to orthopedics.  The thought was that this is a synovial cyst off the PIP joint.  Observation alone if asymptomatic, consider for excision otherwise.  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  Past history of breast cancer.    Plan         Follow up with information regarding right nail. Patient will be asked to return to the office in one year with a right screening mammogram.   HPI, Physical Exam, Assessment and Plan have been scribed under the direction and in the presence of Robert Bellow, MD. Karie Fetch, RN  I have completed the exam and reviewed the above documentation for accuracy and completeness.  I agree with the above.  Haematologist has been used and any errors in dictation or transcription are unintentional.  Hervey Ard, M.D., F.A.C.S.  Robert Bellow 06/02/2017, 9:16 PM

## 2017-06-01 NOTE — Patient Instructions (Signed)
The patient is aware to call back for any questions or concerns.  

## 2017-06-02 DIAGNOSIS — M25842 Other specified joint disorders, left hand: Secondary | ICD-10-CM | POA: Insufficient documentation

## 2017-06-03 ENCOUNTER — Telehealth: Payer: Self-pay

## 2017-06-03 NOTE — Telephone Encounter (Signed)
Notified patient as instructed, patient pleased. She will observe area for now.

## 2017-06-03 NOTE — Telephone Encounter (Signed)
-----   Message from Robert Bellow, MD sent at 06/02/2017  9:19 PM EST ----- Please notify the patient that I showed a photo of her finger to the orthopedic service.  They thought this was likely a synovial cyst from the joint.  This can be excised if she is concerned, but if asymptomatic observation alone is warranted.  If she would like to see the orthopedics, arrange for her to see Dr. Rudene Christians at [facility].  Thank you

## 2018-03-27 ENCOUNTER — Other Ambulatory Visit: Payer: Self-pay

## 2018-03-27 DIAGNOSIS — Z1231 Encounter for screening mammogram for malignant neoplasm of breast: Secondary | ICD-10-CM

## 2018-05-22 ENCOUNTER — Ambulatory Visit
Admission: RE | Admit: 2018-05-22 | Discharge: 2018-05-22 | Disposition: A | Discharge status: Home / Self Care | Payer: Medicare Other | Source: Ambulatory Visit | Attending: General Surgery | Admitting: General Surgery

## 2018-05-22 DIAGNOSIS — Z1231 Encounter for screening mammogram for malignant neoplasm of breast: Secondary | ICD-10-CM | POA: Insufficient documentation

## 2018-06-06 ENCOUNTER — Ambulatory Visit: Payer: Medicare Other | Admitting: General Surgery

## 2018-12-29 ENCOUNTER — Encounter: Payer: Self-pay | Admitting: General Surgery

## 2019-03-29 ENCOUNTER — Other Ambulatory Visit: Payer: Self-pay

## 2019-03-29 DIAGNOSIS — Z1231 Encounter for screening mammogram for malignant neoplasm of breast: Secondary | ICD-10-CM

## 2019-04-12 ENCOUNTER — Other Ambulatory Visit: Payer: Self-pay | Admitting: General Surgery

## 2019-04-12 DIAGNOSIS — Z1231 Encounter for screening mammogram for malignant neoplasm of breast: Secondary | ICD-10-CM

## 2019-05-28 ENCOUNTER — Ambulatory Visit
Admission: RE | Admit: 2019-05-28 | Discharge: 2019-05-28 | Disposition: A | Discharge status: Home / Self Care | Payer: Medicare Other | Source: Ambulatory Visit | Attending: Surgery | Admitting: Surgery

## 2019-05-28 DIAGNOSIS — Z1231 Encounter for screening mammogram for malignant neoplasm of breast: Secondary | ICD-10-CM | POA: Diagnosis present

## 2019-05-29 ENCOUNTER — Telehealth: Payer: Self-pay

## 2019-05-29 NOTE — Telephone Encounter (Signed)
-----   Message from Jules Husbands, MD sent at 05/29/2019  2:22 PM EST ----- Please let her know mammo is nml ----- Message ----- From: Interface, Rad Results In Sent: 05/28/2019   4:50 PM EST To: Jules Husbands, MD

## 2019-05-29 NOTE — Telephone Encounter (Signed)
Spoke with patient and notified her that her recent mammogram was normal per Dr.Pabon. Patient verbalizes understanding.

## 2019-05-30 ENCOUNTER — Ambulatory Visit: Payer: Medicare Other | Admitting: Surgery

## 2020-04-08 ENCOUNTER — Other Ambulatory Visit: Payer: Self-pay | Admitting: General Surgery

## 2020-05-28 ENCOUNTER — Other Ambulatory Visit: Payer: Self-pay

## 2020-05-28 ENCOUNTER — Ambulatory Visit
Admission: RE | Admit: 2020-05-28 | Discharge: 2020-05-28 | Disposition: A | Discharge status: Home / Self Care | Payer: Medicare Other | Source: Ambulatory Visit | Attending: General Surgery | Admitting: General Surgery

## 2020-05-28 DIAGNOSIS — Z1231 Encounter for screening mammogram for malignant neoplasm of breast: Secondary | ICD-10-CM

## 2020-06-02 ENCOUNTER — Other Ambulatory Visit: Payer: Self-pay | Admitting: General Surgery

## 2020-06-02 DIAGNOSIS — R928 Other abnormal and inconclusive findings on diagnostic imaging of breast: Secondary | ICD-10-CM

## 2020-06-13 ENCOUNTER — Other Ambulatory Visit: Payer: Self-pay

## 2020-06-13 ENCOUNTER — Ambulatory Visit
Admission: RE | Admit: 2020-06-13 | Discharge: 2020-06-13 | Disposition: A | Discharge status: Home / Self Care | Payer: Medicare Other | Source: Ambulatory Visit | Attending: General Surgery | Admitting: General Surgery

## 2020-06-13 DIAGNOSIS — R928 Other abnormal and inconclusive findings on diagnostic imaging of breast: Secondary | ICD-10-CM | POA: Insufficient documentation

## 2020-06-17 ENCOUNTER — Other Ambulatory Visit: Payer: Self-pay | Admitting: General Surgery

## 2020-06-17 DIAGNOSIS — R928 Other abnormal and inconclusive findings on diagnostic imaging of breast: Secondary | ICD-10-CM

## 2020-06-18 ENCOUNTER — Other Ambulatory Visit: Payer: Self-pay | Admitting: General Surgery

## 2020-06-18 DIAGNOSIS — R928 Other abnormal and inconclusive findings on diagnostic imaging of breast: Secondary | ICD-10-CM

## 2020-06-23 ENCOUNTER — Ambulatory Visit: Payer: Medicare Other

## 2020-06-25 ENCOUNTER — Ambulatory Visit: Payer: Medicare Other

## 2020-07-03 ENCOUNTER — Other Ambulatory Visit: Payer: Self-pay

## 2020-07-03 ENCOUNTER — Ambulatory Visit
Admission: RE | Admit: 2020-07-03 | Discharge: 2020-07-03 | Disposition: A | Discharge status: Home / Self Care | Payer: Medicare Other | Source: Ambulatory Visit | Attending: General Surgery | Admitting: General Surgery

## 2020-07-03 DIAGNOSIS — R928 Other abnormal and inconclusive findings on diagnostic imaging of breast: Secondary | ICD-10-CM

## 2020-07-03 HISTORY — PX: BREAST BIOPSY: SHX20

## 2020-07-09 ENCOUNTER — Encounter: Payer: Self-pay | Admitting: *Deleted

## 2020-07-09 DIAGNOSIS — C50911 Malignant neoplasm of unspecified site of right female breast: Secondary | ICD-10-CM

## 2020-07-09 NOTE — Progress Notes (Signed)
Received message from Electa Sniff, RN that patient had been notified of her new diagnosis of right breast cancer and was ready to speak with a navigator.   Patient would like to see a surgeon at [facility].  I have scheduled her to see Dr. Christian Mate on 07/10/20 @ 9:30 and Dr. Janese Banks for medical oncology on 07/14/20 @ 1:30.  Patient has a history of left breast cancer at age 75.  States she had a mastectomy with reconstruction.  Denies chemo, radiation therapy or antihormonal pills at that time.  She also states family history of breast cancer in her sister in her late 7's, sister with colon cancer in her 78's and a sister with uterine cancer in her 44's.  Patient denies any genetic testing.  Informed her that Dr. Janese Banks may discuss genetic testing with her.  She is to pick up her educational material at her visit with Dr. Christian Mate.

## 2020-07-10 ENCOUNTER — Encounter: Payer: Self-pay | Admitting: Surgery

## 2020-07-10 ENCOUNTER — Ambulatory Visit: Payer: Self-pay | Admitting: Surgery

## 2020-07-10 ENCOUNTER — Other Ambulatory Visit: Payer: Self-pay | Admitting: Surgery

## 2020-07-10 ENCOUNTER — Ambulatory Visit: Payer: Medicare Other | Admitting: Surgery

## 2020-07-10 ENCOUNTER — Other Ambulatory Visit: Payer: Self-pay

## 2020-07-10 VITALS — BP 141/79 | HR 88 | Temp 97.6°F | Ht 61.0 in | Wt 157.8 lb

## 2020-07-10 DIAGNOSIS — I10 Essential (primary) hypertension: Secondary | ICD-10-CM | POA: Insufficient documentation

## 2020-07-10 DIAGNOSIS — C50411 Malignant neoplasm of upper-outer quadrant of right female breast: Secondary | ICD-10-CM | POA: Diagnosis not present

## 2020-07-10 DIAGNOSIS — E785 Hyperlipidemia, unspecified: Secondary | ICD-10-CM | POA: Insufficient documentation

## 2020-07-10 DIAGNOSIS — Z853 Personal history of malignant neoplasm of breast: Secondary | ICD-10-CM

## 2020-07-10 NOTE — Progress Notes (Signed)
Patient ID: Erin Nolan, female   DOB: 1945/12/27, 75 y.o.   MRN: [id number]  Chief Complaint: Right breast cancer  History of Present Illness Erin Nolan is a 75 y.o. female with 2 areas identified on recent screening mammography, one biopsied via ultrasound the other via stereotactic, both in the 10:00 area, but distinctively separate.  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  Ultrasound of the right axilla is negative for lymphadenopathy.  She had a left breast cancer removed by mastectomy in 1987.  She underwent immediate reconstruction and reports not having any radiation or chemotherapy or hormonal treatment that she can recall.   She has history of birth control use for 20 years.  She is clearly postmenopausal.  Her sister developed breast cancer in her 67s, she also had breast cancer in her her 34s herself.  She was pregnant twice with the first born at the age of 42.  She did not feel any lumps, or skin changes from this screening evaluation.  She has been consistent doing her yearly mammography.  Past Medical History Past Medical History:  Diagnosis Date  . Allergy   . Breast cancer ([place]) 1987  . Cancer ([place]) 12/13/1985   LCIS, simple mastectomy, 3 cm area of LCIS, immediate reconstruction.  Marland Kitchen GERD (gastroesophageal reflux disease)   . Hyperlipidemia   . Hypertension   . Hypothyroidism   . Personal history of colonic polyps    2013: Sessile adenoma of ascending colon, Tubular adenoma at 25 cm; 2008: Adenomatous polyp at 30 cm and rectum.1994: Adenomatous polyp at 30 cm;  1992: Adenomatous polyp at 25 cm  . Personal history of tobacco use, presenting hazards to health   . Proctitis 2008  . Shingles 2012  . Special screening for malignant neoplasms, colon   . Thyroid disease 2005   hypothyroidism      Past Surgical History:  Procedure Laterality Date  . BREAST BIOPSY Right 07/03/2020   [place] BX. q clip, path pending   . BREAST BIOPSY  Right 07/03/2020   Stereo Bx, x-clip, path pending   . BREAST EXCISIONAL BIOPSY Right 1988   Fibroadenoma  . BREAST RECONSTRUCTION Left   . BREAST SURGERY Left 1987   mastectomy  . CATARACT EXTRACTION    . COLONOSCOPY  2012   hx of colon polyps, Dr. Bary Castilla  . COLONOSCOPY WITH PROPOFOL N/A 11/24/2016   Procedure: COLONOSCOPY WITH PROPOFOL;  Surgeon: Robert Bellow, MD;  Location: [facility] ENDOSCOPY;  Service: Endoscopy;  Laterality: N/A;  . MASTECTOMY Left 1987   breast ca  . TUBAL LIGATION      Allergies  Allergen Reactions  . Neosporin [Neomycin-Bacitracin Zn-Polymyx] Itching    Current Outpatient Medications  Medication Sig Dispense Refill  . amLODipine (NORVASC) 10 MG tablet Take 1 tablet by mouth daily.    . cholecalciferol (VITAMIN D) 400 UNITS TABS tablet Take 400 Units by mouth.    . citalopram (CELEXA) 10 MG tablet Take 10 mg by mouth daily.    Marland Kitchen KLOR-CON M20 20 MEQ tablet Take 1 tablet by mouth daily.    Marland Kitchen levothyroxine (SYNTHROID, LEVOTHROID) 50 MCG tablet Take 1 tablet by mouth daily at 6 (six) AM.    . Multiple Vitamin (MULTIVITAMIN) capsule Take 1 capsule by mouth daily.    . pantoprazole (PROTONIX) 40 MG tablet Take 1 tablet by mouth daily.    . simvastatin (ZOCOR) 20 MG tablet Take 1 tablet by mouth daily.    Marland Kitchen triamterene-hydrochlorothiazide (  MAXZIDE) 75-50 MG per tablet      No current facility-administered medications for this visit.    Family History Family History  Problem Relation Age of Onset  . Cancer Other        family member with breast cancer  . Cancer Sister   . Breast cancer Sister   . Cancer Sister 64       uterine  . Cancer Sister        colon cancer      Social History Social History   Tobacco Use  . Smoking status: Former Smoker    Packs/day: 1.00    Years: 20.00    Pack years: 20.00    Types: Cigarettes  . Smokeless tobacco: Never Used  Substance Use Topics  . Alcohol use: No  . Drug use: No        Review of Systems   Constitutional: Negative.   HENT: Positive for hearing loss.   Eyes: Negative.   Respiratory: Negative.   Cardiovascular: Negative.   Gastrointestinal: Negative.   Genitourinary: Negative.   Skin: Negative.   Neurological: Negative.   Psychiatric/Behavioral: Negative.       Physical Exam Blood pressure (!) 141/79, pulse 88, temperature 97.6 F (36.4 C), temperature source Oral, height 5' 1" (1.549 m), weight 157 lb 12.8 oz (71.6 kg), SpO2 97 %. Last Weight  Most recent update: 07/10/2020  9:32 AM   Weight  71.6 kg (157 lb 12.8 oz)            CONSTITUTIONAL: Well developed, and nourished, appropriately responsive and aware without distress.   EYES: Sclera non-icteric.   EARS, NOSE, MOUTH AND THROAT: Mask worn.  Hearing is intact to voice.  NECK: Trachea is midline, and there is no jugular venous distension.  LYMPH NODES:  Lymph nodes in the neck are not enlarged. RESPIRATORY:  Lungs are clear, and breath sounds are equal bilaterally. Normal respiratory effort without pathologic use of accessory muscles. CARDIOVASCULAR: Heart is regular in rate and rhythm. GI: The abdomen is soft, nontender, and nondistended. There were no palpable masses. I did not appreciate hepatosplenomegaly. There were normal bowel sounds. GU: Left breast is consistent with implant reconstruction, significant deformity noted, with capsular contracture and scarring present.  No palpable tissue masses or nodularity present, no appreciable skin changes that are suspicious. Right breast is without palpable mass, she has mild ecchymotic changes superficially from the biopsy site.  No palpable hematoma, no suspicious skin changes or dimpling noted.  She has a mild but reportedly chronic minimal nipple retraction.  No appreciable nipple discharge, no appreciable axillary adenopathy. MUSCULOSKELETAL:  Symmetrical muscle tone appreciated in all four extremities.    SKIN: Skin turgor is normal. No pathologic skin lesions  appreciated.  NEUROLOGIC:  Motor and sensation appear grossly normal.  Cranial nerves are grossly without defect. PSYCH:  Alert and oriented to person, place and time. Affect is appropriate for situation.  Data Reviewed I have personally reviewed what is currently available of the patient's imaging, recent labs and medical records.   Labs:  No flowsheet data found. No flowsheet data found.  SURGICAL PATHOLOGY SURGICAL PATHOLOGY  CASE: [id number]  PATIENT: Erin Nolan  Surgical Pathology Report      Specimen Submitted:  A. Right breast, 10:00 5 cm fn; us bx  B. Right breast, upper outer; stereo bx   Clinical History: 1) Q clip within inferior, anterior aspect (8mm to the  center of the mass) of a right breast mass,   10:00. 2) X clip is located  7 mm posterior and inferior to the biopsied area of distortion.       DIAGNOSIS:  A. RIGHT BREAST, 10:00 5CMFN; ULTRASOUND-GUIDED BIOPSY:  - INVASIVE MAMMARY CARCINOMA, NO SPECIAL TYPE.   Size of invasive carcinoma: 7 mm in this sample  Histologic grade of invasive carcinoma: Grade 2            Glandular/tubular differentiation score: 3            Nuclear pleomorphism score: 2            Mitotic rate score: 1            Total score: 6  Ductal carcinoma in situ: Present, low-grade  Lymphovascular invasion: Not identified   Comment:  The definitive grade will be assigned on the excisional specimen.  ER/PR/HER2 immunohistochemistry will be performed on block A2 with  reflex to FISH for HER2 2+. The results will be reported in an addendum.    B. RIGHT BREAST, UPPER OUTER; STEREOTACTIC BIOPSY:  - INVASIVE MAMMARY CARCINOMA, NO SPECIAL TYPE.   Size of invasive carcinoma: 5 mm in this sample  Histologic grade of invasive carcinoma: Grade 1            Glandular/tubular differentiation score: 1            Nuclear pleomorphism score: 2             Mitotic rate score: 1            Total score: 4  Ductal carcinoma in situ: Present, low-grade  Lymphovascular invasion: Not identified   Comment:  The definitive grade will be assigned on the excisional specimen. The  area of interest demonstrates loss of myoepithelial markers p63 and  calponin. ER/PR/HER2: Immunohistochemistry will be performed on block  B1, with reflex to FISH for HER2 2+. The results will be reported in an  addendum.      Imaging: Radiology review:   CLINICAL DATA:  74-year-old female presenting as a recall from screening for possible right breast distortion. History of remote left breast mastectomy. Family history of breast cancer in the patient's sister.  EXAM: DIGITAL DIAGNOSTIC RIGHT MAMMOGRAM WITH TOMO  ULTRASOUND RIGHT BREAST  COMPARISON:  Previous exam(s).  ACR Breast Density Category c: The breast tissue is heterogeneously dense, which may obscure small masses.  FINDINGS: Mammogram:  Spot compression tomosynthesis and full field mL views of the right breast performed demonstrating persistence of two areas of distortion in the outer and upper outer right breast.  On physical exam, I feel one discrete mass in the outer aspect of the patient's right breast.  Ultrasound:  Targeted ultrasound is performed throughout the outer and upper outer aspect of the right breast. At 10 o'clock 5 cm from the nipple there is an irregular hypoechoic mass measuring 1.3 x 1.0 x 0.9 cm. This likely corresponds to one of the areas of distortion seen mammographically.  No definite sonographic correlate is identified for the second area of distortion.  Incidentally noted at 9 o'clock 3 cm from nipple there is a benign simple cyst measuring 0.5 cm.  Targeted ultrasound of the right axilla demonstrates normal lymph nodes.  IMPRESSION: Two separate suspicious areas of distortion in the outer and upper outer right breast. There is  a mass in the right breast at 10 o'clock measuring 1.3 cm which likely corresponds to the distortion that is located in the outer right breast. No definite sonographic correlate   identified for the second area of distortion.  RECOMMENDATION: 1. Ultrasound-guided core needle biopsy of the right breast mass at 10 o'clock. Recommend postprocedure clip mammogram to confirm correlation with the distortion in the outer right breast.  2. Stereotactic core needle biopsy of the second area of distortion in the upper outer right breast.  I have discussed the findings and recommendations with the patient who agrees to proceed with biopsies. If applicable, a reminder letter will be sent to the patient regarding the next appointment.  BI-RADS CATEGORY  5: Highly suggestive of malignancy.   Electronically Signed   By: Audie Pinto M.D.   On: 06/13/2020 11:51  Within last 24 hrs: No results found.  Assessment    Right upper outer quadrant breast cancer, apparently multifocal. Patient Active Problem List   Diagnosis Date Noted  . Hyperlipidemia 07/10/2020  . Hypertension 07/10/2020  . Cyst of joint of hand, left 06/02/2017  . History of colonic polyps 05/21/2016  . History of breast cancer 05/25/2014  . History of proctitis 05/25/2014  . Cancer ([place])   . Thyroid disease   . Proctitis 05/22/2007    Plan    We discussed various options including mastectomy, breast conservation with sentinel lymph node biopsy, the role of radiation treatment.  We have also discussed prognostic indicators and their impact, and we still are awaiting these.  I discussed the available options with the patient, and husband.  The risk of recurrence is similar between mastectomy and lumpectomy with radiation.  I also discussed that given multifocal nature of the cancer would recommend a double RFID localization lumpectomy/quadrantectomy with or possibly without radiation to follow.  I also discussed  that we may need to do a sentinel lymph node biopsy to check the nodes.   Explained to the patient that in addition to her surgical treatment she may also benefit from estrogen blockade, or an aromatase inhibitor.    I discussed risks of bleeding, infection, damage to surrounding tissues, having positive margins, needing additional resection, trauma to nerves causing arm numbness or difficulty raising arm, causing lymphoedema in the arm; as well as anesthesia risks of MI, stroke, prolonged ventilation, pulmonary embolism, thrombosis and even death.   Patient was given the opportunity to ask questions and have them answered.  They would like to proceed with right  breast localized lumpectomy possibly without sentinel lymph node biopsy.  Will await final prognostic indicators and consultation with Oncology and Rad Oncology.    She is aware that mastectomy may be necessary should her disease extend beyond the limits of breast conservation.   Face-to-face time spent with the patient and accompanying care providers(if present) was 45 minutes, with more than 50% of the time spent counseling, educating, and coordinating care of the patient.      Ronny Bacon M.D., FACS 07/10/2020, 10:36 AM

## 2020-07-10 NOTE — H&P (View-Only) (Signed)
Patient ID: Erin Nolan, female   DOB: 1945/12/27, 75 y.o.   MRN: [id number]  Chief Complaint: Right breast cancer  History of Present Illness Erin Nolan is a 75 y.o. female with 2 areas identified on recent screening mammography, one biopsied via ultrasound the other via stereotactic, both in the 10:00 area, but distinctively separate.  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  Ultrasound of the right axilla is negative for lymphadenopathy.  She had a left breast cancer removed by mastectomy in 1987.  She underwent immediate reconstruction and reports not having any radiation or chemotherapy or hormonal treatment that she can recall.   She has history of birth control use for 20 years.  She is clearly postmenopausal.  Her sister developed breast cancer in her 67s, she also had breast cancer in her her 34s herself.  She was pregnant twice with the first born at the age of 42.  She did not feel any lumps, or skin changes from this screening evaluation.  She has been consistent doing her yearly mammography.  Past Medical History Past Medical History:  Diagnosis Date  . Allergy   . Breast cancer ([place]) 1987  . Cancer ([place]) 12/13/1985   LCIS, simple mastectomy, 3 cm area of LCIS, immediate reconstruction.  Marland Kitchen GERD (gastroesophageal reflux disease)   . Hyperlipidemia   . Hypertension   . Hypothyroidism   . Personal history of colonic polyps    2013: Sessile adenoma of ascending colon, Tubular adenoma at 25 cm; 2008: Adenomatous polyp at 30 cm and rectum.1994: Adenomatous polyp at 30 cm;  1992: Adenomatous polyp at 25 cm  . Personal history of tobacco use, presenting hazards to health   . Proctitis 2008  . Shingles 2012  . Special screening for malignant neoplasms, colon   . Thyroid disease 2005   hypothyroidism      Past Surgical History:  Procedure Laterality Date  . BREAST BIOPSY Right 07/03/2020   [place] BX. q clip, path pending   . BREAST BIOPSY  Right 07/03/2020   Stereo Bx, x-clip, path pending   . BREAST EXCISIONAL BIOPSY Right 1988   Fibroadenoma  . BREAST RECONSTRUCTION Left   . BREAST SURGERY Left 1987   mastectomy  . CATARACT EXTRACTION    . COLONOSCOPY  2012   hx of colon polyps, Dr. Bary Castilla  . COLONOSCOPY WITH PROPOFOL N/A 11/24/2016   Procedure: COLONOSCOPY WITH PROPOFOL;  Surgeon: Robert Bellow, MD;  Location: [facility] ENDOSCOPY;  Service: Endoscopy;  Laterality: N/A;  . MASTECTOMY Left 1987   breast ca  . TUBAL LIGATION      Allergies  Allergen Reactions  . Neosporin [Neomycin-Bacitracin Zn-Polymyx] Itching    Current Outpatient Medications  Medication Sig Dispense Refill  . amLODipine (NORVASC) 10 MG tablet Take 1 tablet by mouth daily.    . cholecalciferol (VITAMIN D) 400 UNITS TABS tablet Take 400 Units by mouth.    . citalopram (CELEXA) 10 MG tablet Take 10 mg by mouth daily.    Marland Kitchen KLOR-CON M20 20 MEQ tablet Take 1 tablet by mouth daily.    Marland Kitchen levothyroxine (SYNTHROID, LEVOTHROID) 50 MCG tablet Take 1 tablet by mouth daily at 6 (six) AM.    . Multiple Vitamin (MULTIVITAMIN) capsule Take 1 capsule by mouth daily.    . pantoprazole (PROTONIX) 40 MG tablet Take 1 tablet by mouth daily.    . simvastatin (ZOCOR) 20 MG tablet Take 1 tablet by mouth daily.    Marland Kitchen triamterene-hydrochlorothiazide (  MAXZIDE) 75-50 MG per tablet      No current facility-administered medications for this visit.    Family History Family History  Problem Relation Age of Onset  . Cancer Other        family member with breast cancer  . Cancer Sister   . Breast cancer Sister   . Cancer Sister 23       uterine  . Cancer Sister        colon cancer      Social History Social History   Tobacco Use  . Smoking status: Former Smoker    Packs/day: 1.00    Years: 20.00    Pack years: 20.00    Types: Cigarettes  . Smokeless tobacco: Never Used  Substance Use Topics  . Alcohol use: No  . Drug use: No        Review of Systems   Constitutional: Negative.   HENT: Positive for hearing loss.   Eyes: Negative.   Respiratory: Negative.   Cardiovascular: Negative.   Gastrointestinal: Negative.   Genitourinary: Negative.   Skin: Negative.   Neurological: Negative.   Psychiatric/Behavioral: Negative.       Physical Exam Blood pressure (!) 141/79, pulse 88, temperature 97.6 F (36.4 C), temperature source Oral, height 5' 1" (1.549 m), weight 157 lb 12.8 oz (71.6 kg), SpO2 97 %. Last Weight  Most recent update: 07/10/2020  9:32 AM   Weight  71.6 kg (157 lb 12.8 oz)            CONSTITUTIONAL: Well developed, and nourished, appropriately responsive and aware without distress.   EYES: Sclera non-icteric.   EARS, NOSE, MOUTH AND THROAT: Mask worn.  Hearing is intact to voice.  NECK: Trachea is midline, and there is no jugular venous distension.  LYMPH NODES:  Lymph nodes in the neck are not enlarged. RESPIRATORY:  Lungs are clear, and breath sounds are equal bilaterally. Normal respiratory effort without pathologic use of accessory muscles. CARDIOVASCULAR: Heart is regular in rate and rhythm. GI: The abdomen is soft, nontender, and nondistended. There were no palpable masses. I did not appreciate hepatosplenomegaly. There were normal bowel sounds. GU: Left breast is consistent with implant reconstruction, significant deformity noted, with capsular contracture and scarring present.  No palpable tissue masses or nodularity present, no appreciable skin changes that are suspicious. Right breast is without palpable mass, she has mild ecchymotic changes superficially from the biopsy site.  No palpable hematoma, no suspicious skin changes or dimpling noted.  She has a mild but reportedly chronic minimal nipple retraction.  No appreciable nipple discharge, no appreciable axillary adenopathy. MUSCULOSKELETAL:  Symmetrical muscle tone appreciated in all four extremities.    SKIN: Skin turgor is normal. No pathologic skin lesions  appreciated.  NEUROLOGIC:  Motor and sensation appear grossly normal.  Cranial nerves are grossly without defect. PSYCH:  Alert and oriented to person, place and time. Affect is appropriate for situation.  Data Reviewed I have personally reviewed what is currently available of the patient's imaging, recent labs and medical records.   Labs:  No flowsheet data found. No flowsheet data found.  SURGICAL PATHOLOGY SURGICAL PATHOLOGY  CASE: [id number]  PATIENT: Mayking Witkop  Surgical Pathology Report      Specimen Submitted:  A. Right breast, 10:00 5 cm fn; [place] bx  B. Right breast, upper outer; stereo bx   Clinical History: 1) Q clip within inferior, anterior aspect (98m to the  center of the mass) of a right breast mass,  10:00. 2) X clip is located  7 mm posterior and inferior to the biopsied area of distortion.       DIAGNOSIS:  A. RIGHT BREAST, 10:00 5CMFN; ULTRASOUND-GUIDED BIOPSY:  - INVASIVE MAMMARY CARCINOMA, NO SPECIAL TYPE.   Size of invasive carcinoma: 7 mm in this sample  Histologic grade of invasive carcinoma: Grade 2            Glandular/tubular differentiation score: 3            Nuclear pleomorphism score: 2            Mitotic rate score: 1            Total score: 6  Ductal carcinoma in situ: Present, low-grade  Lymphovascular invasion: Not identified   Comment:  The definitive grade will be assigned on the excisional specimen.  ER/PR/HER2 immunohistochemistry will be performed on block A2 with  reflex to [place] for HER2 2+. The results will be reported in an addendum.    B. RIGHT BREAST, UPPER OUTER; STEREOTACTIC BIOPSY:  - INVASIVE MAMMARY CARCINOMA, NO SPECIAL TYPE.   Size of invasive carcinoma: 5 mm in this sample  Histologic grade of invasive carcinoma: Grade 1            Glandular/tubular differentiation score: 1            Nuclear pleomorphism score: 2             Mitotic rate score: 1            Total score: 4  Ductal carcinoma in situ: Present, low-grade  Lymphovascular invasion: Not identified   Comment:  The definitive grade will be assigned on the excisional specimen. The  area of interest demonstrates loss of myoepithelial markers p63 and  calponin. ER/PR/HER2: Immunohistochemistry will be performed on block  B1, with reflex to [place] for HER2 2+. The results will be reported in an  addendum.      Imaging: Radiology review:   CLINICAL DATA:  75 year old female presenting as a recall from screening for possible right breast distortion. History of remote left breast mastectomy. Family history of breast cancer in the patient's sister.  EXAM: DIGITAL DIAGNOSTIC RIGHT MAMMOGRAM WITH TOMO  ULTRASOUND RIGHT BREAST  COMPARISON:  Previous exam(s).  ACR Breast Density Category c: The breast tissue is heterogeneously dense, which may obscure small masses.  FINDINGS: Mammogram:  Spot compression tomosynthesis and full field mL views of the right breast performed demonstrating persistence of two areas of distortion in the outer and upper outer right breast.  On physical exam, I feel one discrete mass in the outer aspect of the patient's right breast.  Ultrasound:  Targeted ultrasound is performed throughout the outer and upper outer aspect of the right breast. At 10 o'clock 5 cm from the nipple there is an irregular hypoechoic mass measuring 1.3 x 1.0 x 0.9 cm. This likely corresponds to one of the areas of distortion seen mammographically.  No definite sonographic correlate is identified for the second area of distortion.  Incidentally noted at 9 o'clock 3 cm from nipple there is a benign simple cyst measuring 0.5 cm.  Targeted ultrasound of the right axilla demonstrates normal lymph nodes.  IMPRESSION: Two separate suspicious areas of distortion in the outer and upper outer right breast. There is  a mass in the right breast at 10 o'clock measuring 1.3 cm which likely corresponds to the distortion that is located in the outer right breast. No definite sonographic correlate  identified for the second area of distortion.  RECOMMENDATION: 1. Ultrasound-guided core needle biopsy of the right breast mass at 10 o'clock. Recommend postprocedure clip mammogram to confirm correlation with the distortion in the outer right breast.  2. Stereotactic core needle biopsy of the second area of distortion in the upper outer right breast.  I have discussed the findings and recommendations with the patient who agrees to proceed with biopsies. If applicable, a reminder letter will be sent to the patient regarding the next appointment.  BI-RADS CATEGORY  5: Highly suggestive of malignancy.   Electronically Signed   By: Audie Pinto M.D.   On: 06/13/2020 11:51  Within last 24 hrs: No results found.  Assessment    Right upper outer quadrant breast cancer, apparently multifocal. Patient Active Problem List   Diagnosis Date Noted  . Hyperlipidemia 07/10/2020  . Hypertension 07/10/2020  . Cyst of joint of hand, left 06/02/2017  . History of colonic polyps 05/21/2016  . History of breast cancer 05/25/2014  . History of proctitis 05/25/2014  . Cancer ([place])   . Thyroid disease   . Proctitis 05/22/2007    Plan    We discussed various options including mastectomy, breast conservation with sentinel lymph node biopsy, the role of radiation treatment.  We have also discussed prognostic indicators and their impact, and we still are awaiting these.  I discussed the available options with the patient, and husband.  The risk of recurrence is similar between mastectomy and lumpectomy with radiation.  I also discussed that given multifocal nature of the cancer would recommend a double RFID localization lumpectomy/quadrantectomy with or possibly without radiation to follow.  I also discussed  that we may need to do a sentinel lymph node biopsy to check the nodes.   Explained to the patient that in addition to her surgical treatment she may also benefit from estrogen blockade, or an aromatase inhibitor.    I discussed risks of bleeding, infection, damage to surrounding tissues, having positive margins, needing additional resection, trauma to nerves causing arm numbness or difficulty raising arm, causing lymphoedema in the arm; as well as anesthesia risks of MI, stroke, prolonged ventilation, pulmonary embolism, thrombosis and even death.   Patient was given the opportunity to ask questions and have them answered.  They would like to proceed with right  breast localized lumpectomy possibly without sentinel lymph node biopsy.  Will await final prognostic indicators and consultation with Oncology and Rad Oncology.    She is aware that mastectomy may be necessary should her disease extend beyond the limits of breast conservation.   Face-to-face time spent with the patient and accompanying care providers(if present) was 45 minutes, with more than 50% of the time spent counseling, educating, and coordinating care of the patient.      Ronny Bacon M.D., FACS 07/10/2020, 10:36 AM

## 2020-07-10 NOTE — Patient Instructions (Addendum)
Please keep your follow up appointment with Dr.Roa 07/14/20. We have placed a referral to radiation Oncology. Someone from their office will call to schedule an appointment.   Our surgery scheduler will call you within 24-48 hours to schedule your surgery, Please have the Blue surgery sheet available when speaking with her.    Lumpectomy, Care After This sheet gives you information about how to care for yourself after your procedure. Your health care provider may also give you more specific instructions. If you have problems or questions, contact your health care provider. What can I expect after the procedure? After the procedure, it is common to have:  Breast swelling.  Breast tenderness.  Stiffness in your arm or shoulder.  A change in the shape and feel of your breast.  Scar tissue that feels hard to the touch in the area where the lump was removed. Follow these instructions at home: Medicines  Take over-the-counter and prescription medicines only as told by your health care provider.  If you were prescribed an antibiotic medicine, take it as told by your health care provider. Do not stop taking the antibiotic even if you start to feel better.  Ask your health care provider if the medicine prescribed to you: ? Requires you to avoid driving or using heavy machinery. ? Can cause constipation. You may need to take these actions to prevent or treat constipation:  Drink enough fluid to keep your urine pale yellow.  Take over-the-counter or prescription medicines.  Eat foods that are high in fiber, such as beans, whole grains, and fresh fruits and vegetables.  Limit foods that are high in fat and processed sugars, such as fried or sweet foods. Incision care  Follow instructions from your health care provider about how to take care of your incision. Make sure you: ? Wash your hands with soap and water before and after you change your bandage (dressing). If soap and water are not  available, use hand sanitizer. ? Change your dressing as told by your health care provider. ? Leave stitches (sutures), skin glue, or adhesive strips in place. These skin closures may need to stay in place for 2 weeks or longer. If adhesive strip edges start to loosen and curl up, you may trim the loose edges. Do not remove adhesive strips completely unless your health care provider tells you to do that.  Check your incision area every day for signs of infection. Check for: ? More redness, swelling, or pain. ? Fluid or blood. ? Warmth. ? Pus or a bad smell.  Keep your dressing clean and dry.  If you were sent home with a surgical drain in place, follow instructions from your health care provider about emptying it.      Bathing  Do not take baths, swim, or use a hot tub until your health care provider approves.  Ask your health care provider if you may take showers. You may only be allowed to take sponge baths. Activity  Rest as told by your health care provider.  Avoid sitting for a long time without moving. Get up to take short walks every 1-2 hours. This is important to improve blood flow and breathing. Ask for help if you feel weak or unsteady.  Return to your normal activities as told by your health care provider. Ask your health care provider what activities are safe for you.  Be careful to avoid any activities that could cause an injury to your arm on the side of your surgery.  Do not lift anything that is heavier than 10 lb (4.5 kg), or the limit that you are told, until your health care provider says that it is safe. Avoid lifting with the arm that is on the side of your surgery.  Do not carry heavy objects on your shoulder on the side of your surgery.  Do exercises to keep your shoulder and arm from getting stiff and swollen. Talk with your health care provider about which exercises are safe for you. General instructions  Wear a supportive bra as told by your health care  provider.  Raise (elevate) your arm above the level of your heart while you are sitting or lying down.  Do not wear tight jewelry on your arm, wrist, or fingers on the side of your surgery.  Keep all follow-up visits as told by your health care provider. This is important. ? You may need to be screened for extra fluid around the lymph nodes and swelling in the breast and arm (lymphedema). Follow instructions from your health care provider about how often you should be checked.  If you had any lymph nodes removed during your procedure, be sure to tell all of your health care providers. This is important information to share before you are involved in certain procedures, such as having blood tests or having your blood pressure taken. Contact a health care provider if:  You develop a rash.  You have a fever.  Your pain medicine is not working.  You have swelling, weakness, or numbness in your arm that does not improve after a few weeks.  You have new swelling in your breast.  You have any of these signs of infection: ? More redness, swelling, or pain in your incision area. ? Fluid or blood coming from your incision. ? Warmth coming from the incision area. ? Pus or a bad smell coming from your incision. Get help right away if you have:  Very bad pain in your breast or arm.  Swelling in your legs or arms.  Redness, warmth, or pain in your leg or arm.  Chest pain.  Difficulty breathing. Summary  After the procedure, it is common to have breast tenderness, swelling in your breast, and stiffness in your arm and shoulder.  Follow instructions from your health care provider about how to take care of your incision.  Do not lift anything that is heavier than 10 lb (4.5 kg), or the limit that you are told, until your health care provider says that it is safe. Avoid lifting with the arm that is on the side of your surgery.  If you had any lymph nodes removed during your procedure, be  sure to tell all of your health care providers. This is important information to share before you are involved in certain procedures, such as having blood tests or having your blood pressure taken. This information is not intended to replace advice given to you by your health care provider. Make sure you discuss any questions you have with your health care provider. Document Revised: 11/27/2018 Document Reviewed: 11/27/2018 Elsevier Patient Education  [place].

## 2020-07-11 ENCOUNTER — Other Ambulatory Visit: Payer: Self-pay | Admitting: Surgery

## 2020-07-11 DIAGNOSIS — C50411 Malignant neoplasm of upper-outer quadrant of right female breast: Secondary | ICD-10-CM

## 2020-07-14 ENCOUNTER — Other Ambulatory Visit: Payer: Self-pay

## 2020-07-14 ENCOUNTER — Inpatient Hospital Stay: Payer: Medicare Other

## 2020-07-14 ENCOUNTER — Telehealth: Payer: Self-pay | Admitting: Surgery

## 2020-07-14 ENCOUNTER — Encounter
Admission: RE | Admit: 2020-07-14 | Discharge: 2020-07-14 | Disposition: A | Discharge status: Home / Self Care | Payer: Medicare Other | Source: Ambulatory Visit | Attending: Surgery | Admitting: Surgery

## 2020-07-14 ENCOUNTER — Encounter: Payer: Self-pay | Admitting: *Deleted

## 2020-07-14 ENCOUNTER — Encounter: Payer: Self-pay | Admitting: Oncology

## 2020-07-14 ENCOUNTER — Inpatient Hospital Stay: Payer: Medicare Other | Attending: Oncology | Admitting: Oncology

## 2020-07-14 VITALS — BP 124/64 | HR 91 | Temp 97.6°F | Resp 16 | Ht 61.0 in | Wt 156.0 lb

## 2020-07-14 DIAGNOSIS — Z853 Personal history of malignant neoplasm of breast: Secondary | ICD-10-CM | POA: Diagnosis not present

## 2020-07-14 DIAGNOSIS — K219 Gastro-esophageal reflux disease without esophagitis: Secondary | ICD-10-CM | POA: Insufficient documentation

## 2020-07-14 DIAGNOSIS — I10 Essential (primary) hypertension: Secondary | ICD-10-CM | POA: Diagnosis not present

## 2020-07-14 DIAGNOSIS — Z79899 Other long term (current) drug therapy: Secondary | ICD-10-CM | POA: Insufficient documentation

## 2020-07-14 DIAGNOSIS — C50411 Malignant neoplasm of upper-outer quadrant of right female breast: Secondary | ICD-10-CM | POA: Diagnosis not present

## 2020-07-14 DIAGNOSIS — Z01818 Encounter for other preprocedural examination: Secondary | ICD-10-CM | POA: Diagnosis present

## 2020-07-14 DIAGNOSIS — E785 Hyperlipidemia, unspecified: Secondary | ICD-10-CM | POA: Insufficient documentation

## 2020-07-14 DIAGNOSIS — Z87891 Personal history of nicotine dependence: Secondary | ICD-10-CM | POA: Insufficient documentation

## 2020-07-14 DIAGNOSIS — C50911 Malignant neoplasm of unspecified site of right female breast: Secondary | ICD-10-CM | POA: Diagnosis not present

## 2020-07-14 DIAGNOSIS — Z7189 Other specified counseling: Secondary | ICD-10-CM | POA: Diagnosis not present

## 2020-07-14 DIAGNOSIS — Z9012 Acquired absence of left breast and nipple: Secondary | ICD-10-CM | POA: Insufficient documentation

## 2020-07-14 DIAGNOSIS — E039 Hypothyroidism, unspecified: Secondary | ICD-10-CM | POA: Insufficient documentation

## 2020-07-14 HISTORY — DX: Anxiety disorder, unspecified: F41.9

## 2020-07-14 LAB — COMPREHENSIVE METABOLIC PANEL
ALT: 16 U/L (ref 0–44)
AST: 17 U/L (ref 15–41)
Albumin: 4.7 g/dL (ref 3.5–5.0)
Alkaline Phosphatase: 81 U/L (ref 38–126)
Anion gap: 11 (ref 5–15)
BUN: 14 mg/dL (ref 8–23)
CO2: 26 mmol/L (ref 22–32)
Calcium: 9.6 mg/dL (ref 8.9–10.3)
Chloride: 98 mmol/L (ref 98–111)
Creatinine, Ser: 0.68 mg/dL (ref 0.44–1.00)
GFR, Estimated: >60 mL/min (ref >60)
Glucose, Bld: 106 mg/dL — ABNORMAL HIGH (ref 70–99)
Potassium: 3.6 mmol/L (ref 3.5–5.1)
Sodium: 135 mmol/L (ref 135–145)
Total Bilirubin: 1 mg/dL (ref 0.3–1.2)
Total Protein: 7.6 g/dL (ref 6.5–8.1)

## 2020-07-14 LAB — CBC WITH DIFFERENTIAL/PLATELET
Abs Immature Granulocytes: 0.02 10*3/uL (ref 0.00–0.07)
Basophils Absolute: 0.1 10*3/uL (ref 0.0–0.1)
Basophils Relative: 1 %
Eosinophils Absolute: 0.1 10*3/uL (ref 0.0–0.5)
Eosinophils Relative: 1 %
HCT: 42.9 % (ref 36.0–46.0)
Hemoglobin: 14.1 g/dL (ref 12.0–15.0)
Immature Granulocytes: 0 %
Lymphocytes Relative: 19 %
Lymphs Abs: 1.4 10*3/uL (ref 0.7–4.0)
MCH: 27.7 pg (ref 26.0–34.0)
MCHC: 32.9 g/dL (ref 30.0–36.0)
MCV: 84.3 fL (ref 80.0–100.0)
Monocytes Absolute: 0.6 10*3/uL (ref 0.1–1.0)
Monocytes Relative: 9 %
Neutro Abs: 4.9 10*3/uL (ref 1.7–7.7)
Neutrophils Relative %: 70 %
Platelets: 297 10*3/uL (ref 150–400)
RBC: 5.09 MIL/uL (ref 3.87–5.11)
RDW: 13.3 % (ref 11.5–15.5)
WBC: 7 10*3/uL (ref 4.0–10.5)
nRBC: 0 % (ref 0.0–0.2)

## 2020-07-14 NOTE — Telephone Encounter (Signed)
Patient and her husband (who got on the phone and was being rude about all the times they are having to travel back and forth for her care) both have been advised of Pre-Admission date/time, COVID testing date and Surgery date.  Surgery Date: 07/18/20 Preadmission Testing Date: 07/14/20 (phone 8a-1p) Covid Testing Date: 07/16/20 - patient advised to go to the [facility] ([place] between 8a-1p  As patient is having SLN bx done prior to surgery on 07/18/20 they were informed to arrive no later than 7:45 am.  Patient's husband still continued to complain about the arrival time.

## 2020-07-14 NOTE — Progress Notes (Signed)
Met patient today during her initial medical oncology consult with Dr. Janese Banks.  Patient states she is scheduled for surgery on Friday.   A lumpectomy is scheduled with Dr. Christian Mate.  Patient states she will consider radiation therapy, but does not want chemotherapy.  She discussed her desires with Dr. Janese Banks.  Patient is scheduled to return to Dr. Janese Banks on 08/01/20 and also has a consult with Dr. Baruch Gouty for the same day.  I did call the patients husband and reviewed plan, patient's request, and that all the pathology is not back at this time.  He would like to come in with the patient at her next visit if possible.  Gave patient breast cancer educational literature, "My Breast Cancer Treatment Handbook" by Josephine Igo, RN.

## 2020-07-14 NOTE — Patient Instructions (Addendum)
Your procedure is scheduled on: 07/18/20 Report to [facility].  To find out your arrival time please call [phone] between 1PM - 3PM on 07/17/20.  Remember: Instructions that are not followed completely may result in serious medical risk, up to and including death, or upon the discretion of your surgeon and anesthesiologist your surgery may need to be rescheduled.     _X__ 1. Do not eat food after midnight the night before your procedure.                 No gum chewing or hard candies. You may drink clear liquids up to 2 hours                 before you are scheduled to arrive for your surgery- DO not drink clear                 liquids within 2 hours of the start of your surgery.                 Clear Liquids include:  water, apple juice without pulp, clear carbohydrate                 drink such as Clearfast or Gatorade, Black Coffee or Tea (Do not add                 anything to coffee or tea). Diabetics water only  __X__2.  On the morning of surgery brush your teeth with toothpaste and water, you                 may rinse your mouth with mouthwash if you wish.  Do not swallow any              toothpaste of mouthwash.     _X__ 3.  No Alcohol for 24 hours before or after surgery.   _X__ 4.  Do Not Smoke or use e-cigarettes For 24 Hours Prior to Your Surgery.                 Do not use any chewable tobacco products for at least 6 hours prior to                 surgery.  ____  5.  Bring all medications with you on the day of surgery if instructed.   __X__  6.  Notify your doctor if there is any change in your medical condition      (cold, fever, infections).     Do not wear jewelry, make-up, hairpins, clips or nail polish. Do not wear lotions, powders, or perfumes.  Do not shave 48 hours prior to surgery. Men may shave face and neck. Do not bring valuables to the hospital.    [facility] is not responsible for any belongings  or valuables.  Contacts, dentures/partials or body piercings may not be worn into surgery. Bring a case for your contacts, glasses or hearing aids, a denture cup will be supplied. Leave your suitcase in the car. After surgery it may be brought to your room. For patients admitted to the hospital, discharge time is determined by your treatment team.   Patients discharged the day of surgery will not be allowed to drive home.   Please read over the following fact sheets that you were given:   MRSA Information  __X__ Take these medicines the morning of surgery with A SIP OF WATER:  1. amLODipine (NORVASC) 10 MG tablet  2. levothyroxine (SYNTHROID, LEVOTHROID) 50 MCG tablet  3. pantoprazole (PROTONIX) 40 MG tablet  4.  5.  6.  ____ Fleet Enema (as directed)   __X__ Use CHG Soap/SAGE wipes as directed  ____ Use inhalers on the day of surgery  ____ Stop metformin/Janumet/Farxiga 2 days prior to surgery    ____ Take 1/2 of usual insulin dose the night before surgery. No insulin the morning          of surgery.   ____ Stop Blood Thinners Coumadin/Plavix/Xarelto/Pleta/Pradaxa/Eliquis/Effient/Aspirin  on   Or contact your Surgeon, Cardiologist or Medical Doctor regarding  ability to stop your blood thinners  __X__ Stop Anti-inflammatories 7 days before surgery such as Advil, Ibuprofen, Motrin,  BC or Goodies Powder, Naprosyn, Naproxen, Aleve, Aspirin    __X__ Stop all herbal supplements, fish oil or vitamin E until after surgery.  Stop glucosamine today  ____ Bring C-Pap to the hospital.

## 2020-07-14 NOTE — Progress Notes (Signed)
Pt had breast cancer on left side 34 years ago and got mastectomy. Pt. Now has breast cancer on right side- she is very adamant for no chemo at all. She is sore from bx of her breast. 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

## 2020-07-14 NOTE — Progress Notes (Signed)
Hematology/Oncology Consult note [facility] Telephone:([phone] [phone]  Patient Care Team: Maryland Pink, MD as PCP - General (Family Medicine) Bary Castilla, Forest Gleason, MD (General Surgery) Maryland Pink, MD as Referring Physician (Family Medicine) Maryland Pink, MD as Referring Physician (Family Medicine)   Name of the patient: Erin Nolan  [id number]  1945/06/20    Reason for referral-new diagnosis of breast cancer   Referring physician-Dr. Maryland Pink  Date of visit: 07/14/20   History of presenting illness- Patient is a 75 year old female with a past medical history significant for hypertension hyperlipidemia hypothyroidism among other medical problems.  She recently underwent a screening right breast mammogram which showed 2 possible areas of distortion in the right breast.  This was followed by an ultrasound which showed a hypoechoic mass in the right breast measuring 1.3 x 1 x 0.9 cm at the 10 o'clock position 5 cm from the nipple.  No sonographic correlate for the secondary of distortion.  Simple cyst of 0.5 cm at 9 o'clock position.  Normal-appearing right axillary lymph nodes.  Patient underwent biopsy of the primary breast mass which showed invasive mammary carcinoma 7 mm grade 2.  The other mass without sonographic correlate was also biopsied and was consistent with grade 1 invasive mammary carcinoma.  ER/PR and HER-2 is currently pending  Patient has met with Dr. Christian Mate and plan is for a double lumpectomy/quadrantectomy this week.  ECOG PS- 1  Pain scale- 0   Review of systems- Review of Systems  Constitutional: Negative for chills, fever, malaise/fatigue and weight loss.  HENT: Negative for congestion, ear discharge and nosebleeds.   Eyes: Negative for blurred vision.  Respiratory: Negative for cough, hemoptysis, sputum production, shortness of breath and wheezing.   Cardiovascular: Negative for chest pain, palpitations,  orthopnea and claudication.  Gastrointestinal: Negative for abdominal pain, blood in stool, constipation, diarrhea, heartburn, melena, nausea and vomiting.  Genitourinary: Negative for dysuria, flank pain, frequency, hematuria and urgency.  Musculoskeletal: Negative for back pain, joint pain and myalgias.  Skin: Negative for rash.  Neurological: Negative for dizziness, tingling, focal weakness, seizures, weakness and headaches.  Endo/Heme/Allergies: Does not bruise/bleed easily.  Psychiatric/Behavioral: Negative for depression and suicidal ideas. The patient does not have insomnia.     Allergies  Allergen Reactions  . Neosporin [Neomycin-Bacitracin Zn-Polymyx] Itching    Patient Active Problem List   Diagnosis Date Noted  . Hyperlipidemia 07/10/2020  . Hypertension 07/10/2020  . Malignant neoplasm of upper-outer quadrant of right female breast ([place]) 07/10/2020  . Cyst of joint of hand, left 06/02/2017  . History of colonic polyps 05/21/2016  . History of breast cancer 05/25/2014  . History of proctitis 05/25/2014  . Cancer ([place])   . Thyroid disease   . Proctitis 05/22/2007     Past Medical History:  Diagnosis Date  . Allergy   . Anxiety   . Breast cancer ([place]) 1987  . Cancer ([place]) 12/13/1985   LCIS, simple mastectomy, 3 cm area of LCIS, immediate reconstruction.  Marland Kitchen GERD (gastroesophageal reflux disease)   . Hyperlipidemia   . Hypertension   . Hypothyroidism   . Personal history of colonic polyps    2013: Sessile adenoma of ascending colon, Tubular adenoma at 25 cm; 2008: Adenomatous polyp at 30 cm and rectum.1994: Adenomatous polyp at 30 cm;  1992: Adenomatous polyp at 25 cm  . Personal history of tobacco use, presenting hazards to health   . Proctitis 2008  . Shingles 2012  . Special screening for  malignant neoplasms, colon   . Thyroid disease 2005   hypothyroidism     Past Surgical History:  Procedure Laterality Date  . BREAST BIOPSY Right 07/03/2020   [place] BX.  q clip, path pending   . BREAST BIOPSY Right 07/03/2020   Stereo Bx, x-clip, path pending   . BREAST EXCISIONAL BIOPSY Right 1988   Fibroadenoma  . BREAST RECONSTRUCTION Left   . BREAST SURGERY Left 1987   mastectomy  . CATARACT EXTRACTION    . COLONOSCOPY  2012   hx of colon polyps, Dr. Bary Castilla  . COLONOSCOPY WITH PROPOFOL N/A 11/24/2016   Procedure: COLONOSCOPY WITH PROPOFOL;  Surgeon: Robert Bellow, MD;  Location: [facility] ENDOSCOPY;  Service: Endoscopy;  Laterality: N/A;  . MASTECTOMY Left 1987   breast ca  . TUBAL LIGATION      Social History   Socioeconomic History  . Marital status: Married    Spouse name: Not on file  . Number of children: Not on file  . Years of education: Not on file  . Highest education level: Not on file  Occupational History  . Not on file  Tobacco Use  . Smoking status: Former Smoker    Packs/day: 1.00    Years: 20.00    Pack years: 20.00    Types: Cigarettes  . Smokeless tobacco: Never Used  Vaping Use  . Vaping Use: Never used  Substance and Sexual Activity  . Alcohol use: No  . Drug use: No  . Sexual activity: Not on file  Other Topics Concern  . Not on file  Social History Narrative  . Not on file   Social Determinants of Health   Financial Resource Strain: Not on file  Food Insecurity: Not on file  Transportation Needs: Not on file  Physical Activity: Not on file  Stress: Not on file  Social Connections: Not on file  Intimate Partner Violence: Not on file     Family History  Problem Relation Age of Onset  . Cancer Other        family member with breast cancer  . Cancer Sister   . Breast cancer Sister   . Cancer Sister 68       uterine  . Cancer Sister        colon cancer     Current Outpatient Medications:  .  acetaminophen (TYLENOL) 500 MG tablet, Take 500 mg by mouth every 6 (six) hours as needed (for pain)., Disp: , Rfl:  .  amLODipine (NORVASC) 10 MG tablet, Take 10 mg by mouth daily., Disp: , Rfl:  .   cholecalciferol (VITAMIN D3) 25 MCG (1000 UNIT) tablet, Take 1,000 Units by mouth every evening., Disp: , Rfl:  .  citalopram (CELEXA) 10 MG tablet, Take 10 mg by mouth every evening., Disp: , Rfl:  .  Glucosamine-Chondroitin (COSAMIN DS PO), Take 1 tablet by mouth in the morning and at bedtime., Disp: , Rfl:  .  KLOR-CON M20 20 MEQ tablet, Take 20 mEq by mouth 2 (two) times daily., Disp: , Rfl:  .  levothyroxine (SYNTHROID, LEVOTHROID) 50 MCG tablet, Take 25-50 mcg by mouth See admin instructions. Take 0.5 tablet (25 mcg) by mouth every other day alternating with taking 1 tablet (50 mcg) by mouth every other day in the morning before breakfast, Disp: , Rfl:  .  Multiple Vitamin (MULTIVITAMIN WITH MINERALS) TABS tablet, Take 1 tablet by mouth every evening., Disp: , Rfl:  .  pantoprazole (PROTONIX) 40 MG tablet, Take 40  mg by mouth daily., Disp: , Rfl:  .  Polyethyl Glycol-Propyl Glycol 0.4-0.3 % SOLN, Place 1 drop into both eyes See admin instructions. Instill one drop into eyes scheduled at bedtime, may use during the day if needed for irritation., Disp: , Rfl:  .  simvastatin (ZOCOR) 20 MG tablet, Take 20 mg by mouth every evening., Disp: , Rfl:  .  triamterene-hydrochlorothiazide (MAXZIDE) 75-50 MG per tablet, Take 0.5 tablets by mouth daily., Disp: , Rfl:    Physical exam:  Vitals:   07/14/20 1349  BP: 124/64  Pulse: 91  Resp: 16  Temp: 97.6 F (36.4 C)  TempSrc: Oral  Weight: 156 lb (70.8 kg)  Height: 5' 1"  (1.549 m)   Physical Exam Constitutional:      Comments: Thin elderly female in no acute distress  Eyes:     Extraocular Movements: EOM normal.     Pupils: Pupils are equal, round, and reactive to light.  Cardiovascular:     Rate and Rhythm: Normal rate and regular rhythm.     Heart sounds: Normal heart sounds.  Pulmonary:     Effort: Pulmonary effort is normal.     Breath sounds: Normal breath sounds.  Abdominal:     General: Bowel sounds are normal.     Palpations:  Abdomen is soft.  Skin:    General: Skin is warm and dry.  Neurological:     Mental Status: She is alert and oriented to person, place, and time.     Breast exam: Induration at the site of biopsy.  Right breast mass not palpable.  No palpable masses in the left breast.  No palpable bilateral axillary adenopathy.   CMP Latest Ref Rng & Units 07/14/2020  Glucose 70 - 99 mg/dL 106(H)  BUN 8 - 23 mg/dL 14  Creatinine 0.44 - 1.00 mg/dL 0.68  Sodium 135 - 145 mmol/L 135  Potassium 3.5 - 5.1 mmol/L 3.6  Chloride 98 - 111 mmol/L 98  CO2 22 - 32 mmol/L 26  Calcium 8.9 - 10.3 mg/dL 9.6  Total Protein 6.5 - 8.1 g/dL 7.6  Total Bilirubin 0.3 - 1.2 mg/dL 1.0  Alkaline Phos 38 - 126 U/L 81  AST 15 - 41 U/L 17  ALT 0 - 44 U/L 16   CBC Latest Ref Rng & Units 07/14/2020  WBC 4.0 - 10.5 K/uL 7.0  Hemoglobin 12.0 - 15.0 g/dL 14.1  Hematocrit 36.0 - 46.0 % 42.9  Platelets 150 - 400 K/uL 297    No images are attached to the encounter.  MM CLIP PLACEMENT RIGHT  Result Date: 07/03/2020 CLINICAL DATA:  Status post ultrasound-guided core needle biopsy of a 1.3 cm mass in the 10 o'clock position of the right breast and 3D stereotactic guided core needle biopsy of an area distortion more anteriorly in the upper-outer quadrant of the right breast. EXAM: DIAGNOSTIC RIGHT MAMMOGRAM POST ULTRASOUND AND STEREOTACTIC BIOPSIES COMPARISON:  Previous exam(s). FINDINGS: Mammographic images were obtained following ultrasound guided biopsy of the recently demonstrated 1.3 cm mass in the 10 o'clock position of the right breast. The Q shaped biopsy marking clip is located at the inferior, anterior aspect of the biopsied mass with distortion in the 10 o'clock position of the right breast, 8 mm anterior and inferior to the center of the biopsied mass. Additional mammographic images of the right breast were obtained following 3D stereotactic guided core needle biopsy of 2nd area of distortion recently demonstrated more  anteriorly in the upper outer right breast.  The X shaped biopsy marker clip is located 7 mm posterior and inferior to the center of the area of biopsied distortion. IMPRESSION: 1. The Q shaped biopsy marker clip is located 8 mm anterior and inferior to the center of the biopsied mass with distortion in the 10 o'clock position of the right breast. 2. The X shaped biopsy marker clip is located 7 mm posterior and inferior to the center of the area of biopsied distortion more anteriorly in the upper-outer quadrant of the right breast. Final Assessment: Post Procedure Mammograms for Marker Placement Electronically Signed   By: Claudie Revering M.D.   On: 07/03/2020 11:28   MM RT BREAST BX W LOC DEV 1ST LESION IMAGE BX SPEC STEREO GUIDE  Addendum Date: 07/09/2020   ADDENDUM REPORT: 07/09/2020 11:17 ADDENDUM: PATHOLOGY revealed: Site A. RIGHT BREAST, 10:00 5CMFN; ULTRASOUND-GUIDED BIOPSY: - INVASIVE MAMMARY CARCINOMA, NO SPECIAL TYPE. 7 mm in this sample. Grade 2. Ductal carcinoma in situ: Present, low-grade. Lymphovascular invasion: Not identified. Pathology results are CONCORDANT with imaging findings, per Dr. Claudie Revering. PATHOLOGY revealed: Site B. RIGHT BREAST, UPPER OUTER; STEREOTACTIC BIOPSY: - INVASIVE MAMMARY CARCINOMA, NO SPECIAL TYPE. Size of invasive carcinoma: 5 mm in this sample. Grade 1. Ductal carcinoma in situ: Present, low-grade. Lymphovascular invasion: Not identified. Pathology results are CONCORDANT with imaging findings, per Dr. Claudie Revering. Pathology results and recommendations below were discussed with patient by telephone on 07/08/2020. Patient reported biopsy site within normal limits with slight tenderness at the site. Post biopsy care instructions were reviewed, questions were answered and my direct phone number was provided to patient. Patient was instructed to call [facility] if any concerns or questions arise related to the biopsy. Recommend surgical consultation: Request for  surgical consultation relayed to Al Pimple RN and Tanya Nones RN at [facility] by Electa Sniff RN on 07/08/2020. Pathology results reported by Electa Sniff RN on 07/09/2020. Electronically Signed   By: Claudie Revering M.D.   On: 07/09/2020 11:17   Result Date: 07/09/2020 CLINICAL DATA:  1.3 cm mass in the 10 o'clock position of the right breast with recent imaging features highly suspicious for malignancy. This is in the region of 2 areas of distortion seen mammographically. Previous left mastectomy for breast cancer. EXAM: ULTRASOUND GUIDED RIGHT BREAST CORE NEEDLE BIOPSY 3D STEREOTACTIC GUIDED RIGHT BREAST CORE NEEDLE BIOPSY COMPARISON:  Previous exam(s). PROCEDURE: I met with the patient and we discussed the procedures of ultrasound-guided biopsy and 3D stereotactic guided biopsy, including benefits and alternatives. We discussed the high likelihood of successful procedures. We discussed the risks of the procedures, including infection, bleeding, tissue injury, clip migration, and inadequate sampling. Informed written consent was given. The usual time-out protocol was performed immediately prior to the procedures. SITE 1: 1.3 CM MASS WITH DISTORTION IN THE 10 O'CLOCK POSITION OF THE RIGHT BREAST Lesion quadrant: Upper outer quadrant Using sterile technique and 1% Lidocaine as local anesthetic, under direct ultrasound visualization, a 12 gauge spring-loaded device was used to perform biopsy of the recently demonstrated 1.3 cm mass in the 10 o'clock position of the right breast, 5 cm from the nipple, using a caudal approach. At the conclusion of the procedure AQ shaped tissue marker clip was deployed into the biopsy cavity. Follow up 2 view mammogram was performed and dictated separately. SITE 2: DISTORTION IN THE UPPER OUTER QUADRANT OF THE RIGHT BREAST MORE ANTERIORLY Using sterile technique and 1% Lidocaine as local anesthetic, under stereotactic guidance, a 9 gauge vacuum  assisted device was  used to perform core needle biopsy of the recently demonstrated distortion more anteriorly in the upper-outer quadrant of the right breast using a cephalad approach. Lesion quadrant: Upper outer quadrant At the conclusion of the procedure, an X shaped tissue marker clip was deployed into the biopsy cavity. Follow-up 2-view mammogram was performed and dictated separately. IMPRESSION: Ultrasound guided biopsy of the recently demonstrated 1.3 cm mass with distortion in the 10 o'clock position of the right breast and 3D stereotactic guided core needle biopsy of the recently demonstrated distortion more anteriorly in the upper-outer quadrant of the right breast. No apparent complications. Electronically Signed: By: Claudie Revering M.D. On: 07/03/2020 11:39   [place] RT BREAST BX W LOC DEV 1ST LESION IMG BX SPEC US GUIDE  Addendum Date: 07/09/2020   ADDENDUM REPORT: 07/09/2020 11:17 ADDENDUM: PATHOLOGY revealed: Site A. RIGHT BREAST, 10:00 5CMFN; ULTRASOUND-GUIDED BIOPSY: - INVASIVE MAMMARY CARCINOMA, NO SPECIAL TYPE. 7 mm in this sample. Grade 2. Ductal carcinoma in situ: Present, low-grade. Lymphovascular invasion: Not identified. Pathology results are CONCORDANT with imaging findings, per Dr. Claudie Revering. PATHOLOGY revealed: Site B. RIGHT BREAST, UPPER OUTER; STEREOTACTIC BIOPSY: - INVASIVE MAMMARY CARCINOMA, NO SPECIAL TYPE. Size of invasive carcinoma: 5 mm in this sample. Grade 1. Ductal carcinoma in situ: Present, low-grade. Lymphovascular invasion: Not identified. Pathology results are CONCORDANT with imaging findings, per Dr. Claudie Revering. Pathology results and recommendations below were discussed with patient by telephone on 07/08/2020. Patient reported biopsy site within normal limits with slight tenderness at the site. Post biopsy care instructions were reviewed, questions were answered and my direct phone number was provided to patient. Patient was instructed to call [facility] if any concerns or questions  arise related to the biopsy. Recommend surgical consultation: Request for surgical consultation relayed to Al Pimple RN and Tanya Nones RN at [facility] by Electa Sniff RN on 07/08/2020. Pathology results reported by Electa Sniff RN on 07/09/2020. Electronically Signed   By: Claudie Revering M.D.   On: 07/09/2020 11:17   Result Date: 07/09/2020 CLINICAL DATA:  1.3 cm mass in the 10 o'clock position of the right breast with recent imaging features highly suspicious for malignancy. This is in the region of 2 areas of distortion seen mammographically. Previous left mastectomy for breast cancer. EXAM: ULTRASOUND GUIDED RIGHT BREAST CORE NEEDLE BIOPSY 3D STEREOTACTIC GUIDED RIGHT BREAST CORE NEEDLE BIOPSY COMPARISON:  Previous exam(s). PROCEDURE: I met with the patient and we discussed the procedures of ultrasound-guided biopsy and 3D stereotactic guided biopsy, including benefits and alternatives. We discussed the high likelihood of successful procedures. We discussed the risks of the procedures, including infection, bleeding, tissue injury, clip migration, and inadequate sampling. Informed written consent was given. The usual time-out protocol was performed immediately prior to the procedures. SITE 1: 1.3 CM MASS WITH DISTORTION IN THE 10 O'CLOCK POSITION OF THE RIGHT BREAST Lesion quadrant: Upper outer quadrant Using sterile technique and 1% Lidocaine as local anesthetic, under direct ultrasound visualization, a 12 gauge spring-loaded device was used to perform biopsy of the recently demonstrated 1.3 cm mass in the 10 o'clock position of the right breast, 5 cm from the nipple, using a caudal approach. At the conclusion of the procedure AQ shaped tissue marker clip was deployed into the biopsy cavity. Follow up 2 view mammogram was performed and dictated separately. SITE 2: DISTORTION IN THE UPPER OUTER QUADRANT OF THE RIGHT BREAST MORE ANTERIORLY Using sterile technique and 1% Lidocaine as local  anesthetic, under stereotactic guidance, a 9 gauge vacuum assisted device was used to perform core needle biopsy of the recently demonstrated distortion more anteriorly in the upper-outer quadrant of the right breast using a cephalad approach. Lesion quadrant: Upper outer quadrant At the conclusion of the procedure, an X shaped tissue marker clip was deployed into the biopsy cavity. Follow-up 2-view mammogram was performed and dictated separately. IMPRESSION: Ultrasound guided biopsy of the recently demonstrated 1.3 cm mass with distortion in the 10 o'clock position of the right breast and 3D stereotactic guided core needle biopsy of the recently demonstrated distortion more anteriorly in the upper-outer quadrant of the right breast. No apparent complications. Electronically Signed: By: Claudie Revering M.D. On: 07/03/2020 11:39    Assessment and plan- Patient is a 75 y.o. female With newly diagnosed stage I right breast cancer with 2 foci of involvement.  ER/PR and HER-2 pending here to discuss further management  Discussed with the patient that mammogram showed 1 biopsy-proven area of 1.3 x 1 x 0.9 cm at 10 o'clock position 5 cm from the nipple.  There was also a second area of distortion noted on screening mammogram but did not have an ultrasound correlate.  This area was biopsied as well and was consistent with invasive mammary carcinoma grade 1.  ER/PR and HER-2 status is currently pending.  Patient is already met with Dr. Christian Mate and plan is for an upfront quadrantectomy/double lumpectomy if possible.  If patient has positive margins she may land up needing a mastectomy.  At this time I will await final pathology results before making any further recommendations.  It is likely that her breast cancer would be ER/PR positive and HER-2 negative given the low-grade.  She would benefit from 5 years of hormone therapy.  Patient is not interested in considering chemotherapy even if it would be an option.  I will  therefore not plan to send any Oncotype on her tumor specimen.  If patient undergoes a lumpectomy she would benefit from adjuvant radiation therapy and I would like her to see Dr. Donella Stade in 2 weeks when I see her as well to discuss final pathology results.  Treatment will be given with a curative intent   Thank you for this kind referral and the opportunity to participate in the care of this  Patient   Visit Diagnosis 1. Malignant neoplasm of right female breast, unspecified estrogen receptor status, unspecified site of breast ([place])   2. Goals of care, counseling/discussion     Dr. Randa Evens, MD, MPH [facility] [phone] 07/14/2020

## 2020-07-15 ENCOUNTER — Ambulatory Visit
Admission: RE | Admit: 2020-07-15 | Discharge: 2020-07-15 | Disposition: A | Discharge status: Home / Self Care | Payer: Medicare Other | Source: Ambulatory Visit | Attending: Surgery | Admitting: Surgery

## 2020-07-15 DIAGNOSIS — C50411 Malignant neoplasm of upper-outer quadrant of right female breast: Secondary | ICD-10-CM | POA: Insufficient documentation

## 2020-07-16 ENCOUNTER — Other Ambulatory Visit
Admission: RE | Admit: 2020-07-16 | Discharge: 2020-07-16 | Disposition: A | Discharge status: Home / Self Care | Payer: Medicare Other | Source: Ambulatory Visit | Attending: Surgery | Admitting: Surgery

## 2020-07-16 ENCOUNTER — Other Ambulatory Visit: Payer: Self-pay

## 2020-07-16 DIAGNOSIS — Z01812 Encounter for preprocedural laboratory examination: Secondary | ICD-10-CM | POA: Insufficient documentation

## 2020-07-16 DIAGNOSIS — Z20822 Contact with and (suspected) exposure to covid-19: Secondary | ICD-10-CM | POA: Insufficient documentation

## 2020-07-16 LAB — SARS CORONAVIRUS 2 (TAT 6-24 HRS): SARS Coronavirus 2: NEGATIVE

## 2020-07-18 ENCOUNTER — Ambulatory Visit
Admission: RE | Admit: 2020-07-18 | Discharge: 2020-07-18 | Disposition: A | Discharge status: Home / Self Care | Payer: Medicare Other | Attending: Surgery | Admitting: Surgery

## 2020-07-18 ENCOUNTER — Ambulatory Visit
Admission: RE | Admit: 2020-07-18 | Discharge: 2020-07-18 | Disposition: A | Discharge status: Home / Self Care | Payer: Medicare Other | Source: Ambulatory Visit | Attending: Surgery | Admitting: Surgery

## 2020-07-18 ENCOUNTER — Encounter
Admission: RE | Admit: 2020-07-18 | Discharge: 2020-07-18 | Disposition: A | Discharge status: Home / Self Care | Payer: Medicare Other | Source: Ambulatory Visit | Attending: Surgery | Admitting: Surgery

## 2020-07-18 ENCOUNTER — Other Ambulatory Visit: Payer: Self-pay

## 2020-07-18 ENCOUNTER — Ambulatory Visit: Payer: Medicare Other | Admitting: Certified Registered Nurse Anesthetist

## 2020-07-18 ENCOUNTER — Encounter: Payer: Self-pay | Admitting: Surgery

## 2020-07-18 ENCOUNTER — Encounter
Admission: RE | Disposition: A | Discharge status: Home / Self Care | Payer: Self-pay | Source: Home / Self Care | Attending: Surgery

## 2020-07-18 DIAGNOSIS — Z888 Allergy status to other drugs, medicaments and biological substances status: Secondary | ICD-10-CM | POA: Insufficient documentation

## 2020-07-18 DIAGNOSIS — C773 Secondary and unspecified malignant neoplasm of axilla and upper limb lymph nodes: Secondary | ICD-10-CM | POA: Insufficient documentation

## 2020-07-18 DIAGNOSIS — Z170 Estrogen receptor positive status [ER+]: Secondary | ICD-10-CM | POA: Diagnosis not present

## 2020-07-18 DIAGNOSIS — Z79899 Other long term (current) drug therapy: Secondary | ICD-10-CM | POA: Insufficient documentation

## 2020-07-18 DIAGNOSIS — Z853 Personal history of malignant neoplasm of breast: Secondary | ICD-10-CM | POA: Diagnosis not present

## 2020-07-18 DIAGNOSIS — C50411 Malignant neoplasm of upper-outer quadrant of right female breast: Secondary | ICD-10-CM

## 2020-07-18 DIAGNOSIS — Z87891 Personal history of nicotine dependence: Secondary | ICD-10-CM | POA: Diagnosis not present

## 2020-07-18 DIAGNOSIS — Z803 Family history of malignant neoplasm of breast: Secondary | ICD-10-CM | POA: Diagnosis not present

## 2020-07-18 HISTORY — PX: BREAST LUMPECTOMY,RADIO FREQ LOCALIZER,AXILLARY SENTINEL LYMPH NODE BIOPSY: SHX6900

## 2020-07-18 SURGERY — BREAST LUMPECTOMY,RADIO FREQ LOCALIZER,AXILLARY SENTINEL LYMPH NODE BIOPSY
Anesthesia: General | Laterality: Right

## 2020-07-18 MED ORDER — ORAL CARE MOUTH RINSE: 15.0000 mL | Freq: Once | OROMUCOSAL | Status: AC

## 2020-07-18 MED ORDER — BUPIVACAINE-EPINEPHRINE (PF) 0.25% -1:200000 IJ SOLN
INTRAMUSCULAR | Status: DC | PRN
  Administered 2020-07-18: 30 mL

## 2020-07-18 MED ORDER — BUPIVACAINE LIPOSOME 1.3 % IJ SUSP
INTRAMUSCULAR | Status: AC
  Filled 2020-07-18: qty 20

## 2020-07-18 MED ORDER — FENTANYL CITRATE (PF) 100 MCG/2ML IJ SOLN
INTRAMUSCULAR | Status: AC
  Filled 2020-07-18: qty 2

## 2020-07-18 MED ORDER — GABAPENTIN 300 MG PO CAPS
300.0000 mg | ORAL_CAPSULE | ORAL | Status: AC
  Administered 2020-07-18: 300 mg via ORAL

## 2020-07-18 MED ORDER — FENTANYL CITRATE (PF) 100 MCG/2ML IJ SOLN
25.0000 ug | INTRAMUSCULAR | Status: DC | PRN
  Administered 2020-07-18 (×3): 25 ug via INTRAVENOUS

## 2020-07-18 MED ORDER — LACTATED RINGERS IV SOLN: INTRAVENOUS | Status: DC

## 2020-07-18 MED ORDER — LIDOCAINE HCL (CARDIAC) PF 100 MG/5ML IV SOSY
PREFILLED_SYRINGE | INTRAVENOUS | Status: DC | PRN
  Administered 2020-07-18: 100 mg via INTRAVENOUS

## 2020-07-18 MED ORDER — ACETAMINOPHEN 500 MG PO TABS
1000.0000 mg | ORAL_TABLET | ORAL | Status: AC
  Administered 2020-07-18: 1000 mg via ORAL

## 2020-07-18 MED ORDER — PROPOFOL 10 MG/ML IV BOLUS
INTRAVENOUS | Status: DC | PRN
  Administered 2020-07-18: 120 mg via INTRAVENOUS

## 2020-07-18 MED ORDER — FENTANYL CITRATE (PF) 100 MCG/2ML IJ SOLN
INTRAMUSCULAR | Status: DC | PRN
  Administered 2020-07-18: 50 ug via INTRAVENOUS
  Administered 2020-07-18 (×2): 25 ug via INTRAVENOUS

## 2020-07-18 MED ORDER — ISOSULFAN BLUE 1 % ~~LOC~~ SOLN
SUBCUTANEOUS | Status: AC
  Filled 2020-07-18: qty 5

## 2020-07-18 MED ORDER — BUPIVACAINE-EPINEPHRINE (PF) 0.25% -1:200000 IJ SOLN
INTRAMUSCULAR | Status: AC
  Filled 2020-07-18: qty 30

## 2020-07-18 MED ORDER — FENTANYL CITRATE (PF) 100 MCG/2ML IJ SOLN
INTRAMUSCULAR | Status: AC
  Administered 2020-07-18: 25 ug via INTRAVENOUS
  Filled 2020-07-18: qty 2

## 2020-07-18 MED ORDER — ONDANSETRON HCL 4 MG/2ML IJ SOLN
INTRAMUSCULAR | Status: AC
  Filled 2020-07-18: qty 2

## 2020-07-18 MED ORDER — TECHNETIUM TC 99M TILMANOCEPT KIT
1.0000 | PACK | Freq: Once | INTRAVENOUS | Status: AC | PRN
  Administered 2020-07-18: 1.06 via INTRADERMAL

## 2020-07-18 MED ORDER — ACETAMINOPHEN 500 MG PO TABS
ORAL_TABLET | ORAL | Status: AC
  Filled 2020-07-18: qty 2

## 2020-07-18 MED ORDER — ONDANSETRON HCL 4 MG/2ML IJ SOLN
4.0000 mg | Freq: Once | INTRAMUSCULAR | Status: AC | PRN
  Administered 2020-07-18: 4 mg via INTRAVENOUS

## 2020-07-18 MED ORDER — CHLORHEXIDINE GLUCONATE CLOTH 2 % EX PADS
6.0000 | MEDICATED_PAD | Freq: Once | CUTANEOUS | Status: AC
  Administered 2020-07-18: 6 via TOPICAL

## 2020-07-18 MED ORDER — CHLORHEXIDINE GLUCONATE 0.12 % MT SOLN
15.0000 mL | Freq: Once | OROMUCOSAL | Status: AC
  Administered 2020-07-18: 15 mL via OROMUCOSAL

## 2020-07-18 MED ORDER — PROPOFOL 10 MG/ML IV BOLUS
INTRAVENOUS | Status: AC
  Filled 2020-07-18: qty 20

## 2020-07-18 MED ORDER — HYDROCODONE-ACETAMINOPHEN 5-325 MG PO TABS: 1.0000 | ORAL_TABLET | Freq: Four times a day (QID) | ORAL | 0 refills | Status: DC | PRN

## 2020-07-18 MED ORDER — CHLORHEXIDINE GLUCONATE 0.12 % MT SOLN
OROMUCOSAL | Status: AC
  Filled 2020-07-18: qty 15

## 2020-07-18 MED ORDER — CHLORHEXIDINE GLUCONATE CLOTH 2 % EX PADS: 6.0000 | MEDICATED_PAD | Freq: Once | CUTANEOUS | Status: DC

## 2020-07-18 MED ORDER — PHENYLEPHRINE HCL (PRESSORS) 10 MG/ML IV SOLN
INTRAVENOUS | Status: AC
  Filled 2020-07-18: qty 1

## 2020-07-18 MED ORDER — DEXAMETHASONE SODIUM PHOSPHATE 10 MG/ML IJ SOLN
INTRAMUSCULAR | Status: DC | PRN
  Administered 2020-07-18: 5 mg via INTRAVENOUS

## 2020-07-18 MED ORDER — ONDANSETRON HCL 4 MG/2ML IJ SOLN
INTRAMUSCULAR | Status: DC | PRN
  Administered 2020-07-18: 4 mg via INTRAVENOUS

## 2020-07-18 MED ORDER — GLYCOPYRROLATE 0.2 MG/ML IJ SOLN
INTRAMUSCULAR | Status: DC | PRN
  Administered 2020-07-18: .2 mg via INTRAVENOUS

## 2020-07-18 MED ORDER — CELECOXIB 200 MG PO CAPS
200.0000 mg | ORAL_CAPSULE | ORAL | Status: AC
  Administered 2020-07-18: 200 mg via ORAL

## 2020-07-18 MED ORDER — CEFAZOLIN SODIUM-DEXTROSE 2-4 GM/100ML-% IV SOLN
2.0000 g | INTRAVENOUS | Status: AC
  Administered 2020-07-18: 2 g via INTRAVENOUS

## 2020-07-18 MED ORDER — PHENYLEPHRINE HCL (PRESSORS) 10 MG/ML IV SOLN
INTRAVENOUS | Status: DC | PRN
  Administered 2020-07-18 (×2): 100 ug via INTRAVENOUS

## 2020-07-18 MED ORDER — ISOSULFAN BLUE 1 % ~~LOC~~ SOLN
SUBCUTANEOUS | Status: DC | PRN
  Administered 2020-07-18: 5 mg via SUBCUTANEOUS

## 2020-07-18 MED ORDER — EPHEDRINE SULFATE 50 MG/ML IJ SOLN
INTRAMUSCULAR | Status: DC | PRN
  Administered 2020-07-18: 10 mg via INTRAVENOUS

## 2020-07-18 MED ORDER — CEFAZOLIN SODIUM-DEXTROSE 2-4 GM/100ML-% IV SOLN
INTRAVENOUS | Status: AC
  Filled 2020-07-18: qty 100

## 2020-07-18 MED ORDER — GABAPENTIN 300 MG PO CAPS
ORAL_CAPSULE | ORAL | Status: AC
  Filled 2020-07-18: qty 1

## 2020-07-18 MED ORDER — EPHEDRINE 5 MG/ML INJ
INTRAVENOUS | Status: AC
  Filled 2020-07-18: qty 10

## 2020-07-18 MED ORDER — CELECOXIB 200 MG PO CAPS
ORAL_CAPSULE | ORAL | Status: AC
  Filled 2020-07-18: qty 1

## 2020-07-18 SURGICAL SUPPLY — 42 items
ADH SKN CLS APL DERMABOND .7 (GAUZE/BANDAGES/DRESSINGS) ×1
APL PRP STRL LF DISP 70% ISPRP (MISCELLANEOUS) ×1
APPLIER CLIP 9.375 SM OPEN (CLIP)
APR CLP SM 9.3 20 MLT OPN (CLIP)
BLADE SURG 15 STRL LF DISP TIS (BLADE) ×1 IMPLANT
BLADE SURG 15 STRL SS (BLADE) ×2
CANISTER SUCT 1200ML W/VALVE (MISCELLANEOUS) ×2 IMPLANT
CHLORAPREP W/TINT 26 (MISCELLANEOUS) ×2 IMPLANT
CLIP APPLIE 9.375 SM OPEN (CLIP) IMPLANT
CNTNR SPEC 2.5X3XGRAD LEK (MISCELLANEOUS)
CONT SPEC 4OZ STER OR WHT (MISCELLANEOUS)
CONT SPEC 4OZ STRL OR WHT (MISCELLANEOUS)
CONTAINER SPEC 2.5X3XGRAD LEK (MISCELLANEOUS) IMPLANT
COVER WAND RF STERILE (DRAPES) ×2 IMPLANT
DECANTER SPIKE VIAL GLASS SM (MISCELLANEOUS) ×2 IMPLANT
DERMABOND ADVANCED (GAUZE/BANDAGES/DRESSINGS) ×1
DERMABOND ADVANCED .7 DNX12 (GAUZE/BANDAGES/DRESSINGS) ×1 IMPLANT
DEVICE DUBIN SPECIMEN MAMMOGRA (MISCELLANEOUS) ×2 IMPLANT
DRAPE LAPAROTOMY TRNSV 106X77 (MISCELLANEOUS) ×2 IMPLANT
ELECT CAUTERY BLADE TIP 2.5 (TIP) ×2
ELECT REM PT RETURN 9FT ADLT (ELECTROSURGICAL) ×2
ELECTRODE CAUTERY BLDE TIP 2.5 (TIP) ×1 IMPLANT
ELECTRODE REM PT RTRN 9FT ADLT (ELECTROSURGICAL) ×1 IMPLANT
GLOVE ORTHO TXT STRL SZ7.5 (GLOVE) ×2 IMPLANT
GOWN STRL REUS W/ TWL LRG LVL3 (GOWN DISPOSABLE) ×2 IMPLANT
GOWN STRL REUS W/TWL LRG LVL3 (GOWN DISPOSABLE) ×4
KIT MARKER MARGIN INK (KITS) IMPLANT
KIT TURNOVER KIT A (KITS) ×2 IMPLANT
MANIFOLD NEPTUNE II (INSTRUMENTS) ×2 IMPLANT
NEEDLE HYPO 22GX1.5 SAFETY (NEEDLE) ×2 IMPLANT
PACK BASIN MINOR ARMC (MISCELLANEOUS) ×2 IMPLANT
SET LOCALIZER 20 PROBE US (MISCELLANEOUS) ×2 IMPLANT
SET WALTER ACTIVATION W/DRAPE (SET/KITS/TRAYS/PACK) IMPLANT
SLEVE PROBE SENORX GAMMA FIND (MISCELLANEOUS) ×2 IMPLANT
SUT MNCRL 4-0 (SUTURE) ×2
SUT MNCRL 4-0 27XMFL (SUTURE) ×1
SUT VIC AB 3-0 SH 27 (SUTURE) ×2
SUT VIC AB 3-0 SH 27X BRD (SUTURE) ×1 IMPLANT
SUTURE MNCRL 4-0 27XMF (SUTURE) ×1 IMPLANT
SYR 10ML LL (SYRINGE) ×2 IMPLANT
SYR 20ML LL LF (SYRINGE) ×2 IMPLANT
WATER STERILE IRR 1000ML POUR (IV SOLUTION) ×2 IMPLANT

## 2020-07-18 NOTE — Discharge Instructions (Signed)

## 2020-07-18 NOTE — Anesthesia Procedure Notes (Signed)
Procedure Name: Intubation Date/Time: 07/18/2020 11:38 AM Performed by: Lowry Bowl, CRNA Pre-anesthesia Checklist: Patient identified, Emergency Drugs available, Suction available and Patient being monitored Patient Re-evaluated:Patient Re-evaluated prior to induction Oxygen Delivery Method: Circle system utilized Preoxygenation: Pre-oxygenation with 100% oxygen Induction Type: IV induction Ventilation: Mask ventilation without difficulty LMA: LMA inserted LMA Size: 3.0 Tube type: Oral Number of attempts: 1 Placement Confirmation: breath sounds checked- equal and bilateral and positive ETCO2 Tube secured with: Tape Dental Injury: Teeth and Oropharynx as per pre-operative assessment

## 2020-07-18 NOTE — Anesthesia Postprocedure Evaluation (Signed)
Anesthesia Post Note  Patient: Erin Nolan  Procedure(s) Performed: BREAST LUMPECTOMY,RADIO FREQ LOCALIZER,AXILLARY SENTINEL LYMPH NODE BIOPSY (Right )  Patient location during evaluation: PACU Anesthesia Type: General Level of consciousness: awake and alert Pain management: pain level controlled Vital Signs Assessment: post-procedure vital signs reviewed and stable Respiratory status: spontaneous breathing and respiratory function stable Cardiovascular status: stable Anesthetic complications: no   No complications documented.   Last Vitals:  Vitals:   07/18/20 1332 07/18/20 1347  BP: 123/69 124/72  Pulse: 74 78  Resp: 11 12  Temp:    SpO2: 99% 95%    Last Pain:  Vitals:   07/18/20 1347  TempSrc:   PainSc: 5                  Mida Cory K

## 2020-07-18 NOTE — Interval H&P Note (Signed)
History and Physical Interval Note:  07/18/2020 10:46 AM  Erin Nolan  has presented today for surgery, with the diagnosis of right breast cancer.  The various methods of treatment have been discussed with the patient and family. After consideration of risks, benefits and other options for treatment, the patient has consented to  Procedure(s): [place] (Right) as a surgical intervention.  The patient's history has been reviewed, patient examined, no change in status, stable for surgery.  I have reviewed the patient's chart and labs.  Questions were answered to the patient's satisfaction.   The RIGHT side is marked.   Ronny Bacon  Ronny Bacon, M.D., [facility]  07/18/2020 ; 10:46 AM

## 2020-07-18 NOTE — Op Note (Signed)
Pre-operative Diagnosis: Right Breast Cancer     Post-operative Diagnosis: Same  Surgeon: Ronny Bacon, M.D., FACS  Anesthesia: General, LMA  Procedure: Right lumpectomy, RFID tag directed, sentinel node biopsy  Procedure Details  The patient was seen again in the Holding Room. The benefits, complications, treatment options, and expected outcomes were discussed with the patient. The risks of bleeding, infection, recurrence of symptoms, failure to resolve symptoms, hematoma, seroma, open wound, cosmetic deformity, and the need for further surgery were discussed.  The patient was taken to Operating Room, identified as Erin Nolan and the procedure verified.  A Time Out was held and the above information confirmed.  Prior to the induction of general anesthesia, antibiotic prophylaxis was administered. VTE prophylaxis was in place. The patient was positioned in the supine position. Appropriate anesthesia was then administered and tolerated well. The LOCALizer is used to mark the skin for incision.  A visual dye Isosulfan Blue 4 ml was injected periareolar dermis early under aseptic conditions.  Massage was administered to this area for 5 minutes prior to securely taping it.  The chest was prepped with Chloraprep and draped in the sterile fashion.   Attention was turned to the RFID tag localization site where an elliptical incision was made. Dissection using the LOCALizer to perform a lumpectomy with adequate margins was performed. This was done with electrocautery and sharp dissection with Mayo scissors. There was minimal bleeding, and the cavity packed.  The specimen was taken to the back table and painted to demarcate the 6 surfaces of potential margin. The Faxitron imaging look good to me having both markers present. However on later call back, there was some concern that the medial margin was the closest, as close as 5 mm potentially. So I felt it prudent to proceed with excising  additional medial margin and sent it in a separate container, painting the medial surface yellow. Of note I initially mixed up the colors between superior and inferior, the superior was painted blue, and the inferior red. This is contrary to the routine. I returned to the cavity to remove the packing, and hemostasis was confirmed with electrocautery.     Then using the hand-held probe an area of high counts was identified in the axilla, dissection was addressed under direction by the probe aided in dissection of a lymph node which was sent for permanent section. Two were identified the first was blue, counts well over 6000. Second was significantly smaller, not blue with counts over 700. Once assuring that hemostasis was adequate and checked multiple times the wound was closed with interrupted 3-0 Vicryl followed by 4-0 subcuticular Monocryl sutures.  Dermabond is utilized to seal the incision.    Findings: Faxitron imaging: My personal findings confirmed the presence of all the markers, well within the specimen. However callback from pathology indicated the medial margin is the closest, perhaps is much is 5 mm. Additional medial margin was excised primarily to ensure adequacy of margins.  Estimated Blood Loss: Minimal         Drains: None         Specimens: Two sentinel nodes from the right axilla, upper outer quadrant right lumpectomy/partial mastectomy. Additional medial margin.       Complications: None         Condition: Stable  Sentinel Node Biopsy Synoptic Operative Report  Operation performed with curative intent:Yes  Tracer(s) used to identify sentinel nodes in the upfront surgery (non-neoadjuvant) setting (select all that apply):Dye and Radioactive Tracer  Tracer(s) used to identify sentinel nodes in the neoadjuvant setting (select all that apply):N/A  All nodes (colored or non-colored) present at the end of a dye-filled lymphatic channel were removed:Yes   All significantly  radioactive nodes were removed:Yes  All palpable suspicious nodes were removed:Yes  Biopsy-proven positive nodes marked with clips prior to chemotherapy were identified and removed:N/A    Ronny Bacon, M.D., [facility]  07/18/2020 ; 1:25 PM

## 2020-07-18 NOTE — Transfer of Care (Signed)
Immediate Anesthesia Transfer of Care Note  Patient: Erin Nolan  Procedure(s) Performed: BREAST LUMPECTOMY,RADIO FREQ LOCALIZER,AXILLARY SENTINEL LYMPH NODE BIOPSY (Right )  Patient Location: PACU  Anesthesia Type:General  Level of Consciousness: awake, drowsy and patient cooperative  Airway & Oxygen Therapy: Patient Spontanous Breathing and Patient connected to face mask oxygen  Post-op Assessment: Report given to RN and Post -op Vital signs reviewed and stable  Post vital signs: Reviewed and stable  Last Vitals:  Vitals Value Taken Time  BP 135/71 07/18/20 1317  Temp    Pulse 88 07/18/20 1320  Resp 13 07/18/20 1320  SpO2 100 % 07/18/20 1320  Vitals shown include unvalidated device data.  Last Pain:  Vitals:   07/18/20 0828  TempSrc: Oral  PainSc: 0-No pain         Complications: No complications documented.

## 2020-07-18 NOTE — Anesthesia Preprocedure Evaluation (Signed)
Anesthesia Evaluation  Patient identified by MRN, date of birth, ID band Patient awake    Reviewed: Allergy & Precautions, NPO status , Patient's Chart, lab work & pertinent test results  History of Anesthesia Complications Negative for: history of anesthetic complications  Airway Mallampati: III       Dental  (+) Missing, Poor Dentition   Pulmonary neg sleep apnea, neg COPD, Not current smoker, former smoker,           Cardiovascular hypertension, Pt. on medications (-) Past MI and (-) CHF (-) dysrhythmias (-) Valvular Problems/Murmurs     Neuro/Psych neg Seizures Anxiety    GI/Hepatic Neg liver ROS, GERD  Medicated and Controlled,  Endo/Other  neg diabetesHypothyroidism   Renal/GU negative Renal ROS     Musculoskeletal   Abdominal   Peds  Hematology   Anesthesia Other Findings   Reproductive/Obstetrics                             Anesthesia Physical Anesthesia Plan  ASA: III  Anesthesia Plan: General   Post-op Pain Management:    Induction: Intravenous  PONV Risk Score and Plan: 3 and Ondansetron, Dexamethasone and Midazolam  Airway Management Planned: LMA  Additional Equipment:   Intra-op Plan:   Post-operative Plan:   Informed Consent: I have reviewed the patients History and Physical, chart, labs and discussed the procedure including the risks, benefits and alternatives for the proposed anesthesia with the patient or authorized representative who has indicated his/her understanding and acceptance.       Plan Discussed with:   Anesthesia Plan Comments:         Anesthesia Quick Evaluation

## 2020-07-19 ENCOUNTER — Encounter: Payer: Self-pay | Admitting: Surgery

## 2020-07-21 LAB — SURGICAL PATHOLOGY

## 2020-07-22 ENCOUNTER — Other Ambulatory Visit: Payer: Self-pay | Admitting: Anatomic Pathology & Clinical Pathology

## 2020-07-22 LAB — SURGICAL PATHOLOGY

## 2020-07-29 ENCOUNTER — Other Ambulatory Visit: Payer: Self-pay

## 2020-07-29 ENCOUNTER — Encounter: Payer: Self-pay | Admitting: Surgery

## 2020-07-29 ENCOUNTER — Ambulatory Visit (INDEPENDENT_AMBULATORY_CARE_PROVIDER_SITE_OTHER): Payer: Medicare Other | Admitting: Surgery

## 2020-07-29 VITALS — BP 136/75 | HR 94 | Temp 98.6°F | Ht 61.0 in | Wt 156.8 lb

## 2020-07-29 DIAGNOSIS — Z9889 Other specified postprocedural states: Secondary | ICD-10-CM

## 2020-07-29 NOTE — Patient Instructions (Addendum)
If you have any concerns or questions, please feel free to call our office.    Lumpectomy, Care After This sheet gives you information about how to care for yourself after your procedure. Your health care provider may also give you more specific instructions. If you have problems or questions, contact your health care provider. What can I expect after the procedure? After the procedure, it is common to have:  Breast swelling.  Breast tenderness.  Stiffness in your arm or shoulder.  A change in the shape and feel of your breast.  Scar tissue that feels hard to the touch in the area where the lump was removed. Follow these instructions at home: Medicines  Take over-the-counter and prescription medicines only as told by your health care provider.  If you were prescribed an antibiotic medicine, take it as told by your health care provider. Do not stop taking the antibiotic even if you start to feel better.  Ask your health care provider if the medicine prescribed to you: ? Requires you to avoid driving or using heavy machinery. ? Can cause constipation. You may need to take these actions to prevent or treat constipation:  Drink enough fluid to keep your urine pale yellow.  Take over-the-counter or prescription medicines.  Eat foods that are high in fiber, such as beans, whole grains, and fresh fruits and vegetables.  Limit foods that are high in fat and processed sugars, such as fried or sweet foods. Incision care  Follow instructions from your health care provider about how to take care of your incision. Make sure you: ? Wash your hands with soap and water before and after you change your bandage (dressing). If soap and water are not available, use hand sanitizer. ? Change your dressing as told by your health care provider. ? Leave stitches (sutures), skin glue, or adhesive strips in place. These skin closures may need to stay in place for 2 weeks or longer. If adhesive strip  edges start to loosen and curl up, you may trim the loose edges. Do not remove adhesive strips completely unless your health care provider tells you to do that.  Check your incision area every day for signs of infection. Check for: ? More redness, swelling, or pain. ? Fluid or blood. ? Warmth. ? Pus or a bad smell.  Keep your dressing clean and dry.  If you were sent home with a surgical drain in place, follow instructions from your health care provider about emptying it.      Bathing  Do not take baths, swim, or use a hot tub until your health care provider approves.  Ask your health care provider if you may take showers. You may only be allowed to take sponge baths. Activity  Rest as told by your health care provider.  Avoid sitting for a long time without moving. Get up to take short walks every 1-2 hours. This is important to improve blood flow and breathing. Ask for help if you feel weak or unsteady.  Return to your normal activities as told by your health care provider. Ask your health care provider what activities are safe for you.  Be careful to avoid any activities that could cause an injury to your arm on the side of your surgery.  Do not lift anything that is heavier than 10 lb (4.5 kg), or the limit that you are told, until your health care provider says that it is safe. Avoid lifting with the arm that is on the  side of your surgery.  Do not carry heavy objects on your shoulder on the side of your surgery.  Do exercises to keep your shoulder and arm from getting stiff and swollen. Talk with your health care provider about which exercises are safe for you. General instructions  Wear a supportive bra as told by your health care provider.  Raise (elevate) your arm above the level of your heart while you are sitting or lying down.  Do not wear tight jewelry on your arm, wrist, or fingers on the side of your surgery.  Keep all follow-up visits as told by your health  care provider. This is important. ? You may need to be screened for extra fluid around the lymph nodes and swelling in the breast and arm (lymphedema). Follow instructions from your health care provider about how often you should be checked.  If you had any lymph nodes removed during your procedure, be sure to tell all of your health care providers. This is important information to share before you are involved in certain procedures, such as having blood tests or having your blood pressure taken. Contact a health care provider if:  You develop a rash.  You have a fever.  Your pain medicine is not working.  You have swelling, weakness, or numbness in your arm that does not improve after a few weeks.  You have new swelling in your breast.  You have any of these signs of infection: ? More redness, swelling, or pain in your incision area. ? Fluid or blood coming from your incision. ? Warmth coming from the incision area. ? Pus or a bad smell coming from your incision. Get help right away if you have:  Very bad pain in your breast or arm.  Swelling in your legs or arms.  Redness, warmth, or pain in your leg or arm.  Chest pain.  Difficulty breathing. Summary  After the procedure, it is common to have breast tenderness, swelling in your breast, and stiffness in your arm and shoulder.  Follow instructions from your health care provider about how to take care of your incision.  Do not lift anything that is heavier than 10 lb (4.5 kg), or the limit that you are told, until your health care provider says that it is safe. Avoid lifting with the arm that is on the side of your surgery.  If you had any lymph nodes removed during your procedure, be sure to tell all of your health care providers. This is important information to share before you are involved in certain procedures, such as having blood tests or having your blood pressure taken. This information is not intended to replace  advice given to you by your health care provider. Make sure you discuss any questions you have with your health care provider. Document Revised: 11/27/2018 Document Reviewed: 11/27/2018 Elsevier Patient Education  [place].

## 2020-07-29 NOTE — Progress Notes (Signed)
[facility] POST-OP OFFICE VISIT  07/29/2020  HPI: Erin Nolan is a 75 y.o. female 11 days s/p right upper outer quadrant lumpectomy, with sentinel lymph node biopsy.  She reports today she has no history of fever or chills, suffering from no nausea or vomiting.  She denies any wound drainage.  She only has pain when she exerts herself excessively.  She is freely able to lift her arms up over her head.  Vital signs: BP 136/75   Pulse 94   Temp 98.6 F (37 C) (Oral)   Ht $R'5\' 1"'vP$  (1.549 m)   Wt 156 lb 12.8 oz (71.1 kg)   SpO2 97%   BMI 29.63 kg/m    Physical Exam: Constitutional: She appears well at her baseline. Right breast: Incision, is clean dry and intact.  There is no remarkable ecchymosis, hematoma or seroma.  SURGICAL PATHOLOGY  CASE: [id number]  PATIENT: Erin Nolan  Surgical Pathology Report      Specimen Submitted:  A. Breast, right; lumpectomy  B. Breast, right medial; additional margin  C. Sentinel node 1, right axilla  D. Sentinel node 2, right axilla   Clinical History: Right breast cancer     DIAGNOSIS:  A. BREAST, RIGHT; EXCISION:  - MULTIFOCAL INVASIVE MAMMARY CARCINOMA, NO SPECIAL TYPE.  - DUCTAL CARCINOMA IN SITU, LOW GRADE.  - TWO CLIPS, TWO RF TAGS, AND TWO BIOPSY SITES IDENTIFIED.  - SEE CANCER SUMMARY.   B. BREAST, RIGHT, ADDITIONAL MEDIAL MARGIN; EXCISION:  - BENIGN MAMMARY PARENCHYMA WITH FIBROCYSTIC AND PAPILLARY APOCRINE  CHANGES, USUAL DUCTAL HYPERPLASIA, AND INTRADUCTAL PAPILLOMA.  - NEGATIVE FOR RESIDUAL DUCTAL CARCINOMA IN SITU AND INVASIVE CARCINOMA.   C. LYMPH NODE, RIGHT AXILLARY SENTINEL #1; EXCISION:  - ONE OF TWO LYMPH NODES INVOLVED BY MACRO METASTATIC CARCINOMA, 4 MM IN  GREATEST EXTENT (1/2).   D. LYMPH NODE, RIGHT AXILLARY SENTINEL #2; EXCISION:  - ONE LYMPH NODE, NEGATIVE FOR MALIGNANCY (0/1).   CANCER CASE SUMMARY: INVASIVE CARCINOMA OF THE BREAST  Standard(s): AJCC-UICC 8   SPECIMEN   Procedure: Excision  Specimen Laterality: Right   TUMOR  Histologic Type: Invasive carcinoma of no special type (ductal)  Histologic Grade (Nottingham Histologic Score)            Glandular (Acinar)/Tubular Differentiation: 3            Nuclear Pleomorphism: 2            Mitotic Rate: 1            Overall Grade: Grade 2  Tumor Size: 11 mm  Ductal Carcinoma In Situ (DCIS): Present, low-grade  Lymphovascular Invasion: Not identified  Treatment Effect in the Breast: No known presurgical therapy   MARGINS  Margin Status for Invasive Carcinoma: All margins negative for invasive  carcinoma            Distance from closest margin: 7 mm            Specify closest margin: Anterior   Margin Status for DCIS: All margins negative for DCIS            Distance from DCIS to closest margin: Less than 1  mm            Specify closest margin: Superior    REGIONAL LYMPH NODES  Regional Lymph Node Status: Tumor present in regional lymph node(s)            Number of Lymph Nodes with Macrometastases (greater  than 2 mm): 1  Number of Lymph Nodes with Micrometastases (greater  than 0.2 mm to 2 mm and/or greater than 200 cells): 0            Number of Lymph Nodes with Isolated Tumor Cells  (0.2 mm or less OR 200 cells or less): 0            Size of Largest Metastatic Deposit: 4 mm            Extranodal Extension: Not identified            Total Number of Lymph Nodes Examined (sentinel and  non-sentinel): 3            Number of Sentinel Nodes Examined: 3   DISTANT METASTASIS  Distant Site(s) Involved, if applicable: Not applicable   PATHOLOGIC STAGE CLASSIFICATION (pTNM, AJCC 8th Edition):  TNM Descriptors: m (multiple foci of invasive carcinoma)  pT1c  RegionalLymph Nodes Modifier: sn  FI4P  pM - Not applicable    SPECIAL STUDIES  Breast Biomarker Testing Performed on Previous Biopsy: [id number]   CASE SUMMARY: BREAST BIOMARKER TESTS - Mass A, Q-shaped clip  Estrogen Receptor (ER) Status: POSITIVE      Percentage of cells with nuclear positivity: Greater than 90      Average intensity of staining: Strong   Progesterone Receptor (PgR) Status: POSITIVE      Percentage of cells with nuclear positivity: 81-90      Average intensity of staining: Moderate   HER2 (by immunohistochemistry): NEGATIVE (Score 0)   CASE SUMMARY: BREAST BIOMARKER TESTS - Mass B, X-shaped clip  Estrogen Receptor (ER) Status: POSITIVE      Percentage of cells with nuclear positivity: 90-100      Average intensity of staining: Strong   Progesterone Receptor (PgR) Status: POSITIVE      Percentage of cells with nuclear positivity: 90-100      Average intensity of staining: Moderate   HER2 (by immunohistochemistry): NEGATIVE (Score 1+)   Comment:  The CAP invasive breast cancer checklist is based on the findings in the  largest focus of invasive carcinoma (mass A, Q shaped clip). A separate  focus of tumor (mass B, X shaped clip) shows invasive mammary carcinoma  of no special type (ductal), Nottingham histologic score of grade 1,  measuring 10 mm in greatest extent. Mass B focally extends to within 2  mm of the medial margin in the initial excision. However, with  submission of additional medial tissue, this margin is widely clear.  All other surgical margins for mass B are greater than 10 mm from tumor.  Assessment/Plan: This is a 75 y.o. female 11 days s/p breast conservation treatment of right breast cancer, with ER/PR positivity and HER-2 negativity. She has 1 out of 3 lymph nodes showing cancer.  Her margins are negative.  Stage IIA  pT1c pN1a MX RegionalLymph Nodes Modifier: sn   Patient Active Problem List   Diagnosis Date Noted  . Hyperlipidemia 07/10/2020  .  Hypertension 07/10/2020  . Malignant neoplasm of upper-outer quadrant of right female breast ([place]) 07/10/2020  . Cyst of joint of hand, left 06/02/2017  . History of colonic polyps 05/21/2016  . History of breast cancer 05/25/2014  . History of proctitis 05/25/2014  . Cancer ([place])   . Thyroid disease   . 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 Baruch Gouty and Dr. Janese Banks on Friday, and is scheduled to be presented in the breast cancer conference on Monday the 28th.  Advised  her to keep her appointments as scheduled.  We discussed the likelihood of estrogen blockade, and they raise the concerns about transportation/travel for possible radiation therapy. I anticipate seeing her back in 6 months or as needed.   Ronny Bacon M.D., FACS 07/29/2020, 10:35 AM

## 2020-08-01 ENCOUNTER — Encounter: Payer: Self-pay | Admitting: Oncology

## 2020-08-01 ENCOUNTER — Inpatient Hospital Stay: Payer: Medicare Other | Admitting: Oncology

## 2020-08-01 ENCOUNTER — Encounter: Payer: Self-pay | Admitting: Radiation Oncology

## 2020-08-01 ENCOUNTER — Telehealth: Payer: Self-pay | Admitting: Oncology

## 2020-08-01 ENCOUNTER — Ambulatory Visit
Admission: RE | Admit: 2020-08-01 | Discharge: 2020-08-01 | Disposition: A | Discharge status: Home / Self Care | Payer: Medicare Other | Source: Ambulatory Visit | Attending: Radiation Oncology | Admitting: Radiation Oncology

## 2020-08-01 VITALS — BP 153/74 | HR 61 | Temp 96.5°F | Resp 18 | Wt 154.0 lb

## 2020-08-01 VITALS — BP 153/74 | HR 61 | Temp 96.5°F | Wt 158.0 lb

## 2020-08-01 DIAGNOSIS — Z7189 Other specified counseling: Secondary | ICD-10-CM | POA: Diagnosis not present

## 2020-08-01 DIAGNOSIS — C50911 Malignant neoplasm of unspecified site of right female breast: Secondary | ICD-10-CM | POA: Diagnosis not present

## 2020-08-01 DIAGNOSIS — C50411 Malignant neoplasm of upper-outer quadrant of right female breast: Secondary | ICD-10-CM | POA: Diagnosis not present

## 2020-08-01 DIAGNOSIS — Z170 Estrogen receptor positive status [ER+]: Secondary | ICD-10-CM

## 2020-08-01 MED ORDER — ANASTROZOLE 1 MG PO TABS: 1.0000 mg | ORAL_TABLET | Freq: Every day | ORAL | 2 refills | Status: DC

## 2020-08-01 NOTE — Telephone Encounter (Signed)
Called pt to confirm appt for Bone density scan on 10/28/20. Pt agreeable to the day and time. Declined AVS.

## 2020-08-01 NOTE — Progress Notes (Signed)
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  She recently underwent a screening right breast mammogram which showed 2 possible areas of distortion in the right breast.  This was followed by an ultrasound which showed a hypoechoic mass in the right breast measuring 1.3 x 1 x 0.9 cm at the 10 o'clock position 5 cm from the nipple.  No sonographic correlate for the secondary of distortion.  Simple cyst of 0.5 cm at 9 o'clock position.  Normal-appearing right axillary lymph nodes.  Patient underwent biopsy of the primary breast mass which showed invasive mammary carcinoma 7 mm grade 2.  The other mass without sonographic correlate was also biopsied and was consistent with grade 1 invasive mammary carcinoma.  ER/PR positive and HER-2 negative   Patient underwent Lumpectomy by Dr. Christian Mate.  Final pathology showed multifocal invasive mammary carcinoma 11 mm with negative margins.  1 out of 2 sentinel lymph nodes was positive for malignancy.  PT1CPN1A  Interval history-patient reports doing well since her lumpectomy.  She has baseline fatigue but denies other complaints at this time.  She is here with her husband  today  ECOG PS- 1 Pain scale- 0   Review of systems- Review of Systems  Constitutional: Positive for malaise/fatigue. Negative for chills, fever and weight loss.  HENT: Negative for congestion, ear discharge and nosebleeds.   Eyes: Negative for blurred vision.  Respiratory: Negative for cough, hemoptysis, sputum production, shortness of breath and wheezing.   Cardiovascular: Negative for chest pain, palpitations, orthopnea and claudication.  Gastrointestinal: Negative for abdominal pain, blood in stool, constipation, diarrhea, heartburn, melena, nausea and vomiting.  Genitourinary: Negative for dysuria, flank pain, frequency, hematuria and urgency.  Musculoskeletal: Negative for back pain, joint pain and myalgias.  Skin: Negative for rash.  Neurological: Negative for dizziness, tingling, focal weakness, seizures, weakness and headaches.  Endo/Heme/Allergies: Does not bruise/bleed easily.  Psychiatric/Behavioral: Negative for depression and suicidal ideas. The patient does not have insomnia.       Allergies  Allergen Reactions  . Neosporin [Neomycin-Bacitracin Zn-Polymyx] Itching     Past Medical History:  Diagnosis Date  . Allergy   . Anxiety   . Breast cancer ([place]) 1987  . Breast cancer ([place]) 2022  . Cancer ([place]) 12/13/1985   LCIS, simple mastectomy, 3 cm area of LCIS, immediate reconstruction.  Marland Kitchen GERD (gastroesophageal reflux disease)   . Hyperlipidemia   . Hypertension   . Hypothyroidism   . Personal history of colonic polyps    2013: Sessile adenoma of ascending colon, Tubular adenoma at 25 cm; 2008: Adenomatous polyp at 30 cm and rectum.1994: Adenomatous polyp at 30 cm;  1992: Adenomatous polyp at 25 cm  . Personal history of tobacco use, presenting hazards to health   . Proctitis 2008  . Shingles 2012  . Special screening for malignant neoplasms, colon   .  Thyroid disease 2005   hypothyroidism     Past Surgical History:  Procedure Laterality Date  . BREAST  BIOPSY Right 07/03/2020   [place] BX. q clip,[facility]  . BREAST BIOPSY Right 07/03/2020   Stereo Bx, x-clip, [facility]  . BREAST EXCISIONAL BIOPSY Right 1988   Fibroadenoma  . BREAST LUMPECTOMY,RADIO FREQ LOCALIZER,AXILLARY SENTINEL LYMPH NODE BIOPSY Right 07/18/2020   Procedure: BREAST LUMPECTOMY,RADIO FREQ LOCALIZER,AXILLARY SENTINEL LYMPH NODE BIOPSY;  Surgeon: Ronny Bacon, MD;  Location: [facility] ORS;  Service: General;  Laterality: Right;  . BREAST RECONSTRUCTION Left   . BREAST SURGERY Left 1987   mastectomy  . CATARACT EXTRACTION    . COLONOSCOPY  2012   hx of colon polyps, Dr. Bary Castilla  . COLONOSCOPY WITH PROPOFOL N/A 11/24/2016   Procedure: COLONOSCOPY WITH PROPOFOL;  Surgeon: Robert Bellow, MD;  Location: [facility] ENDOSCOPY;  Service: Endoscopy;  Laterality: N/A;  . MASTECTOMY Left 1987   breast ca  . TUBAL LIGATION      Social History   Socioeconomic History  . Marital status: Married    Spouse name: Not on file  . Number of children: Not on file  . Years of education: Not on file  . Highest education level: Not on file  Occupational History  . Not on file  Tobacco Use  . Smoking status: Former Smoker    Packs/day: 1.00    Years: 20.00    Pack years: 20.00    Types: Cigarettes  . Smokeless tobacco: Never Used  Vaping Use  . Vaping Use: Never used  Substance and Sexual Activity  . Alcohol use: No  . Drug use: No  . Sexual activity: Not Currently  Other Topics Concern  . Not on file  Social History Narrative  . Not on file   Social Determinants of Health   Financial Resource Strain: Not on file  Food Insecurity: Not on file  Transportation Needs: Not on file  Physical Activity: Not on file  Stress: Not on file  Social Connections: Not on file  Intimate Partner Violence: Not on file    Family History  Problem Relation Age of Onset  . Cancer Sister   . Breast cancer Sister   . Cancer Sister 20       uterine  . Cancer Sister        colon cancer     Current  Outpatient Medications:  .  anastrozole (ARIMIDEX) 1 MG tablet, Take 1 tablet (1 mg total) by mouth daily., Disp: 30 tablet, Rfl: 2 .  acetaminophen (TYLENOL) 500 MG tablet, Take 500 mg by mouth every 6 (six) hours as needed (for pain)., Disp: , Rfl:  .  amLODipine (NORVASC) 10 MG tablet, Take 10 mg by mouth daily., Disp: , Rfl:  .  cholecalciferol (VITAMIN D3) 25 MCG (1000 UNIT) tablet, Take 1,000 Units by mouth every evening., Disp: , Rfl:  .  citalopram (CELEXA) 10 MG tablet, Take 10 mg by mouth every evening., Disp: , Rfl:  .  Glucosamine-Chondroitin (COSAMIN DS PO), Take 1 tablet by mouth in the morning and at bedtime., Disp: , Rfl:  .  KLOR-CON M20 20 MEQ tablet, Take 20 mEq by mouth 2 (two) times daily., Disp: , Rfl:  .  levothyroxine (SYNTHROID, LEVOTHROID) 50 MCG tablet, Take 25-50 mcg by mouth See admin instructions. Take 0.5 tablet (25 mcg) by mouth every other day alternating with taking 1 tablet (50 mcg) by mouth every other day in the morning before breakfast, Disp: , Rfl:  .  Multiple Vitamin (MULTIVITAMIN WITH MINERALS) TABS tablet, Take 1 tablet by mouth every evening., Disp: , Rfl:  .  pantoprazole (PROTONIX) 40 MG tablet, Take 40 mg by mouth daily., Disp: , Rfl:  .  Polyethyl Glycol-Propyl Glycol 0.4-0.3 % SOLN, Place 1 drop into both eyes See admin instructions. Instill one drop into eyes scheduled at bedtime, may use during the day if needed for irritation., Disp: , Rfl:  .  simvastatin (ZOCOR) 20 MG tablet, Take 20 mg by mouth every evening., Disp: , Rfl:  .  triamterene-hydrochlorothiazide (MAXZIDE) 75-50 MG per tablet, Take 0.5 tablets by mouth daily., Disp: , Rfl:   Physical exam:  Vitals:   08/01/20 1103  BP: (!) 153/74  Pulse: 61  Resp: 18  Temp: (!) 96.5 F (35.8 C)  TempSrc: Tympanic  SpO2: 100%  Weight: 154 lb (69.9 kg)   Physical Exam Constitutional:      General: She is not in acute distress. Eyes:     Extraocular Movements: EOM normal.  Pulmonary:      Effort: Pulmonary effort is normal.  Skin:    General: Skin is warm and dry.  Neurological:     Mental Status: She is alert and oriented to person, place, and time.      CMP Latest Ref Rng & Units 07/14/2020  Glucose 70 - 99 mg/dL 106(H)  BUN 8 - 23 mg/dL 14  Creatinine 0.44 - 1.00 mg/dL 0.68  Sodium 135 - 145 mmol/L 135  Potassium 3.5 - 5.1 mmol/L 3.6  Chloride 98 - 111 mmol/L 98  CO2 22 - 32 mmol/L 26  Calcium 8.9 - 10.3 mg/dL 9.6  Total Protein 6.5 - 8.1 g/dL 7.6  Total Bilirubin 0.3 - 1.2 mg/dL 1.0  Alkaline Phos 38 - 126 U/L 81  AST 15 - 41 U/L 17  ALT 0 - 44 U/L 16   CBC Latest Ref Rng & Units 07/14/2020  WBC 4.0 - 10.5 K/uL 7.0  Hemoglobin 12.0 - 15.0 g/dL 14.1  Hematocrit 36.0 - 46.0 % 42.9  Platelets 150 - 400 K/uL 297    No images are attached to the encounter.  NM Sentinel Node Inj-No Rpt (Breast)  Result Date: 07/18/2020 Sulfur colloid was injected by the nuclear medicine technologist for melanoma sentinel node.   MM Breast Surgical Specimen  Result Date: 07/18/2020 CLINICAL DATA:  Status post radiofrequency tag localized right breast lumpectomy. EXAM: SPECIMEN RADIOGRAPH OF THE RIGHT BREAST COMPARISON:  Previous exam(s). FINDINGS: Status post excision of the right breast. The radiofrequency tags and Q and X shaped clips are present within the specimen. IMPRESSION: Specimen radiograph of the right breast. Electronically Signed   By: Lillia Mountain M.D.   On: 07/18/2020 12:37   MM CLIP PLACEMENT RIGHT  Result Date: 07/03/2020 CLINICAL DATA:  Status post ultrasound-guided core needle biopsy of a 1.3 cm mass in the 10 o'clock position of the right breast and 3D stereotactic guided core needle biopsy of an area distortion more anteriorly in the upper-outer quadrant of the right breast. EXAM: DIAGNOSTIC RIGHT MAMMOGRAM POST ULTRASOUND AND STEREOTACTIC BIOPSIES COMPARISON:  Previous exam(s). FINDINGS: Mammographic images were obtained following ultrasound guided biopsy of the  recently demonstrated 1.3 cm mass in the 10 o'clock position of the right breast. The Q shaped biopsy marking clip is located at the inferior, anterior aspect of the biopsied mass with distortion in the 10 o'clock position of the right breast, 8 mm anterior and inferior to the center of the biopsied mass. Additional  mammographic images of the right breast were obtained following 3D stereotactic guided core needle biopsy of 2nd area of distortion recently demonstrated more anteriorly in the upper outer right breast. The X shaped biopsy marker clip is located 7 mm posterior and inferior to the center of the area of biopsied distortion. IMPRESSION: 1. The Q shaped biopsy marker clip is located 8 mm anterior and inferior to the center of the biopsied mass with distortion in the 10 o'clock position of the right breast. 2. The X shaped biopsy marker clip is located 7 mm posterior and inferior to the center of the area of biopsied distortion more anteriorly in the upper-outer quadrant of the right breast. Final Assessment: Post Procedure Mammograms for Marker Placement Electronically Signed   By: Claudie Revering M.D.   On: 07/03/2020 11:28   MM RT BREAST BX W LOC DEV 1ST LESION IMAGE BX SPEC STEREO GUIDE  Addendum Date: 07/09/2020   ADDENDUM REPORT: 07/09/2020 11:17 ADDENDUM: PATHOLOGY revealed: Site A. RIGHT BREAST, 10:00 5CMFN; ULTRASOUND-GUIDED BIOPSY: - INVASIVE MAMMARY CARCINOMA, NO SPECIAL TYPE. 7 mm in this sample. Grade 2. Ductal carcinoma in situ: Present, low-grade. Lymphovascular invasion: Not identified. Pathology results are CONCORDANT with imaging findings, per Dr. Claudie Revering. PATHOLOGY revealed: Site B. RIGHT BREAST, UPPER OUTER; STEREOTACTIC BIOPSY: - INVASIVE MAMMARY CARCINOMA, NO SPECIAL TYPE. Size of invasive carcinoma: 5 mm in this sample. Grade 1. Ductal carcinoma in situ: Present, low-grade. Lymphovascular invasion: Not identified. Pathology results are CONCORDANT with imaging findings, per Dr.  Claudie Revering. Pathology results and recommendations below were discussed with patient by telephone on 07/08/2020. Patient reported biopsy site within normal limits with slight tenderness at the site. Post biopsy care instructions were reviewed, questions were answered and my direct phone number was provided to patient. Patient was instructed to call [facility] if any concerns or questions arise related to the biopsy. Recommend surgical consultation: Request for surgical consultation relayed to Al Pimple RN and Tanya Nones RN at [facility] by Electa Sniff RN on 07/08/2020. Pathology results reported by Electa Sniff RN on 07/09/2020. Electronically Signed   By: Claudie Revering M.D.   On: 07/09/2020 11:17   Result Date: 07/09/2020 CLINICAL DATA:  1.3 cm mass in the 10 o'clock position of the right breast with recent imaging features highly suspicious for malignancy. This is in the region of 2 areas of distortion seen mammographically. Previous left mastectomy for breast cancer. EXAM: ULTRASOUND GUIDED RIGHT BREAST CORE NEEDLE BIOPSY 3D STEREOTACTIC GUIDED RIGHT BREAST CORE NEEDLE BIOPSY COMPARISON:  Previous exam(s). PROCEDURE: I met with the patient and we discussed the procedures of ultrasound-guided biopsy and 3D stereotactic guided biopsy, including benefits and alternatives. We discussed the high likelihood of successful procedures. We discussed the risks of the procedures, including infection, bleeding, tissue injury, clip migration, and inadequate sampling. Informed written consent was given. The usual time-out protocol was performed immediately prior to the procedures. SITE 1: 1.3 CM MASS WITH DISTORTION IN THE 10 O'CLOCK POSITION OF THE RIGHT BREAST Lesion quadrant: Upper outer quadrant Using sterile technique and 1% Lidocaine as local anesthetic, under direct ultrasound visualization, a 12 gauge spring-loaded device was used to perform biopsy of the recently demonstrated 1.3 cm mass  in the 10 o'clock position of the right breast, 5 cm from the nipple, using a caudal approach. At the conclusion of the procedure AQ shaped tissue marker clip was deployed into the biopsy cavity. Follow up 2 view mammogram was performed and dictated  separately. SITE 2: DISTORTION IN THE UPPER OUTER QUADRANT OF THE RIGHT BREAST MORE ANTERIORLY Using sterile technique and 1% Lidocaine as local anesthetic, under stereotactic guidance, a 9 gauge vacuum assisted device was used to perform core needle biopsy of the recently demonstrated distortion more anteriorly in the upper-outer quadrant of the right breast using a cephalad approach. Lesion quadrant: Upper outer quadrant At the conclusion of the procedure, an X shaped tissue marker clip was deployed into the biopsy cavity. Follow-up 2-view mammogram was performed and dictated separately. IMPRESSION: Ultrasound guided biopsy of the recently demonstrated 1.3 cm mass with distortion in the 10 o'clock position of the right breast and 3D stereotactic guided core needle biopsy of the recently demonstrated distortion more anteriorly in the upper-outer quadrant of the right breast. No apparent complications. Electronically Signed: By: Claudie Revering M.D. On: 07/03/2020 11:39   [place] RT BREAST BX W LOC DEV 1ST LESION IMG BX SPEC US GUIDE  Addendum Date: 07/09/2020   ADDENDUM REPORT: 07/09/2020 11:17 ADDENDUM: PATHOLOGY revealed: Site A. RIGHT BREAST, 10:00 5CMFN; ULTRASOUND-GUIDED BIOPSY: - INVASIVE MAMMARY CARCINOMA, NO SPECIAL TYPE. 7 mm in this sample. Grade 2. Ductal carcinoma in situ: Present, low-grade. Lymphovascular invasion: Not identified. Pathology results are CONCORDANT with imaging findings, per Dr. Claudie Revering. PATHOLOGY revealed: Site B. RIGHT BREAST, UPPER OUTER; STEREOTACTIC BIOPSY: - INVASIVE MAMMARY CARCINOMA, NO SPECIAL TYPE. Size of invasive carcinoma: 5 mm in this sample. Grade 1. Ductal carcinoma in situ: Present, low-grade. Lymphovascular invasion: Not  identified. Pathology results are CONCORDANT with imaging findings, per Dr. Claudie Revering. Pathology results and recommendations below were discussed with patient by telephone on 07/08/2020. Patient reported biopsy site within normal limits with slight tenderness at the site. Post biopsy care instructions were reviewed, questions were answered and my direct phone number was provided to patient. Patient was instructed to call [facility] if any concerns or questions arise related to the biopsy. Recommend surgical consultation: Request for surgical consultation relayed to Al Pimple RN and Tanya Nones RN at [facility] by Electa Sniff RN on 07/08/2020. Pathology results reported by Electa Sniff RN on 07/09/2020. Electronically Signed   By: Claudie Revering M.D.   On: 07/09/2020 11:17   Result Date: 07/09/2020 CLINICAL DATA:  1.3 cm mass in the 10 o'clock position of the right breast with recent imaging features highly suspicious for malignancy. This is in the region of 2 areas of distortion seen mammographically. Previous left mastectomy for breast cancer. EXAM: ULTRASOUND GUIDED RIGHT BREAST CORE NEEDLE BIOPSY 3D STEREOTACTIC GUIDED RIGHT BREAST CORE NEEDLE BIOPSY COMPARISON:  Previous exam(s). PROCEDURE: I met with the patient and we discussed the procedures of ultrasound-guided biopsy and 3D stereotactic guided biopsy, including benefits and alternatives. We discussed the high likelihood of successful procedures. We discussed the risks of the procedures, including infection, bleeding, tissue injury, clip migration, and inadequate sampling. Informed written consent was given. The usual time-out protocol was performed immediately prior to the procedures. SITE 1: 1.3 CM MASS WITH DISTORTION IN THE 10 O'CLOCK POSITION OF THE RIGHT BREAST Lesion quadrant: Upper outer quadrant Using sterile technique and 1% Lidocaine as local anesthetic, under direct ultrasound visualization, a 12 gauge spring-loaded  device was used to perform biopsy of the recently demonstrated 1.3 cm mass in the 10 o'clock position of the right breast, 5 cm from the nipple, using a caudal approach. At the conclusion of the procedure AQ shaped tissue marker clip was deployed into the biopsy cavity. Follow up  2 view mammogram was performed and dictated separately. SITE 2: DISTORTION IN THE UPPER OUTER QUADRANT OF THE RIGHT BREAST MORE ANTERIORLY Using sterile technique and 1% Lidocaine as local anesthetic, under stereotactic guidance, a 9 gauge vacuum assisted device was used to perform core needle biopsy of the recently demonstrated distortion more anteriorly in the upper-outer quadrant of the right breast using a cephalad approach. Lesion quadrant: Upper outer quadrant At the conclusion of the procedure, an X shaped tissue marker clip was deployed into the biopsy cavity. Follow-up 2-view mammogram was performed and dictated separately. IMPRESSION: Ultrasound guided biopsy of the recently demonstrated 1.3 cm mass with distortion in the 10 o'clock position of the right breast and 3D stereotactic guided core needle biopsy of the recently demonstrated distortion more anteriorly in the upper-outer quadrant of the right breast. No apparent complications. Electronically Signed: By: Claudie Revering M.D. On: 07/03/2020 11:39   MM RT RADIO FREQUENCY TAG LOC MAMMO GUIDE  Result Date: 07/15/2020 CLINICAL DATA:  75 year old female presenting for radiofrequency device placement for localization prior to right breast lumpectomy. EXAM: MAMMOGRAPHIC GUIDED RADIOFREQUENCY DEVICE LOCALIZATION OF THE RIGHT BREAST COMPARISON:  Previous exam(s) FINDINGS: Patient presents for radiofrequency device localization prior to right breast lumpectomy. I met with the patient and we discussed the procedure of radiofrequency device localization including benefits and alternatives. We discussed the high likelihood of a successful procedure. We discussed the risks of the  procedure including infection, bleeding, tissue injury and further surgery. Informed, written consent was given. The usual time-out protocol was performed immediately prior to the procedure. #1 RF TAG [id number] Using mammographic guidance, sterile technique, 1% lidocaine as local anesthesia, a radiofrequency tag was used to localize the Q shaped biopsy marking clip in the upper-outer posterior right breast using a lateral approach. The follow-up mammogram images confirm that the RF device is in the expected location and are marked for Dr. Christian Mate. #2 RF TAG [id number] Using mammographic guidance, sterile technique, 1% lidocaine as local anesthesia, a radiofrequency tag was used to localize the X shaped biopsy marking clip in the upper-outer middle depth of the right breast using a lateral approach. The follow-up mammogram images confirm that the RF device is in the expected location and are marked for Dr. Christian Mate. The patient tolerated the procedure well and was released from the [facility]. IMPRESSION: Radiofrequency device localization x 2 of the right breast. No apparent complications. Electronically Signed   By: Ammie Ferrier M.D.   On: 07/15/2020 16:50   MM RT RADIO FREQUENCY TAG EA ADD LESION LOC MAMMO GUIDE  Result Date: 07/15/2020 CLINICAL DATA:  75 year old female presenting for radiofrequency device placement for localization prior to right breast lumpectomy. EXAM: MAMMOGRAPHIC GUIDED RADIOFREQUENCY DEVICE LOCALIZATION OF THE RIGHT BREAST COMPARISON:  Previous exam(s) FINDINGS: Patient presents for radiofrequency device localization prior to right breast lumpectomy. I met with the patient and we discussed the procedure of radiofrequency device localization including benefits and alternatives. We discussed the high likelihood of a successful procedure. We discussed the risks of the procedure including infection, bleeding, tissue injury and further surgery. Informed, written consent was given. The  usual time-out protocol was performed immediately prior to the procedure. #1 RF TAG [id number] Using mammographic guidance, sterile technique, 1% lidocaine as local anesthesia, a radiofrequency tag was used to localize the Q shaped biopsy marking clip in the upper-outer posterior right breast using a lateral approach. The follow-up mammogram images confirm that the RF device is in the expected location and are marked  for Dr. Christian Mate. #2 RF TAG [id number] Using mammographic guidance, sterile technique, 1% lidocaine as local anesthesia, a radiofrequency tag was used to localize the X shaped biopsy marking clip in the upper-outer middle depth of the right breast using a lateral approach. The follow-up mammogram images confirm that the RF device is in the expected location and are marked for Dr. Christian Mate. The patient tolerated the procedure well and was released from the Breast Center. IMPRESSION: Radiofrequency device localization x 2 of the right breast. No apparent complications. Electronically Signed   By: Ammie Ferrier M.D.   On: 07/15/2020 16:50     Assessment and plan- Patient is a 75 y.o. female with newly diagnosed invasive mammary carcinoma of the right breast pathological prognostic stage I apT1 cpN1 acM0 ER/PR positive and HER-2 negative s/p lumpectomy here to discuss further management  Discussed the results of final pathology with the patient which showed a grade 2 invasive mammary carcinoma multifocal 11 mm.  Margins were negative and 1 out of 2 sentinel lymph nodes was positive for malignancy.  ER/PR strongly positive and HER-2 negative.  She therefore has stage I disease.  Discussed that in an ideal scenario she would need Oncotype testing to determine if she would benefit from adjuvant chemotherapy.  Patient is very certain that she does not wish to go through chemotherapy under any circumstances.  I will therefore not proceed with any Oncotype testing at this time.  Patient is agreeable to  proceeding with adjuvant radiation treatment  Given that her tumor was ER PR positive hormone therapy is indicated at this time.  Idiscussed the role for hormone therapy. Given that she is postmenopausal I would favor 5 years of adjuvant hormone therapy with aromatase inhibitor. I discussed the risks and benefits of Arimidex including all but not limited to fatigue, hypercholesterolemia, hot flashes, arthralgias and worsening bone health.  Patient will also need to be on calcium 1200 mg along with vitamin D 800 international units.  We will obtain a baseline bone density scan written information about Arimidex given to the patient. I would like her to finish radiation therapy and start hormone therapy thereafter. Patient verbalized understanding and consents to treatment.  I will see her back in 3 months time after she completes radiation treatment to see how she is tolerating her Arimidex.  Treatment will be given with a curative intent  Cancer Staging Malignant neoplasm of upper-outer quadrant of right female breast [facility]) Staging form: Breast, AJCC 8th Edition - Pathologic stage from 08/01/2020: Stage IA (pT1c, pN1a, cM0, G2, ER+, PR+, HER2-) - Signed by Sindy Guadeloupe, MD on 08/01/2020 Stage prefix: Initial diagnosis Multigene prognostic tests performed: None Histologic grading system: 3 grade system      Visit Diagnosis 1. Malignant neoplasm of right female breast, unspecified estrogen receptor status, unspecified site of breast ([place])   2. Goals of care, counseling/discussion      Dr. Randa Evens, MD, MPH [facility] at [facility] [phone] 08/01/2020 12:56 PM

## 2020-08-01 NOTE — Consult Note (Signed)
NEW PATIENT EVALUATION  Name: Erin Nolan  MRN: [id number]  Date:   08/01/2020     DOB: 05-09-1946   This 75 y.o. 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  ER/PR positive HER-2 negative status post wide local excision and sentinel node biopsy  REFERRING PHYSICIAN: Maryland Pink, MD  CHIEF COMPLAINT:  Chief Complaint  Patient presents with  . Consult    DIAGNOSIS: There were no encounter diagnoses.   PREVIOUS INVESTIGATIONS:  Mammograms and ultrasound reviewed Clinical notes reviewed Pathology report reviewed  HPI: Patient is a 75 year old female who presented with abnormal mammogram showing 2 separate suspicious areas of distortion in the outer and upper outer quadrant of the right breast.  There was a sonographic correlation for the mass in the right breast at 10 o'clock position 1.3 cm from the nipple.  No definitive sonographic correlation for the secondary distortion was noted.  She underwent an ultrasound-guided core biopsy positive for invasive mammary carcinoma.  She then underwent a quadrantectomy of the right breast showing overall grade two 1.1 cm invasive mammary carcinoma with margins clear at 7 mm.  DCIS margin was close at 1 mm.  She had 3 sentinel lymph nodes examined 1 had a micrometastatic focus approximately 4 mm in size.  She has done well postoperatively.  Patient is also status post partial mastectomy back over 30 years prior to her left breast with no adjuvant treatment given.  Patient specifically denies breast tenderness cough or bone pain.  PLANNED TREATMENT REGIMEN: Right whole breast and peripheral lymphatic radiation PAST MEDICAL HISTORY:  has a past medical history of Allergy, Anxiety, Breast cancer ([place]) (1987), Breast cancer ([place]) (2022), Cancer ([place]) (12/13/1985), GERD (gastroesophageal reflux disease), Hyperlipidemia, Hypertension, Hypothyroidism,  Personal history of colonic polyps, Personal history of tobacco use, presenting hazards to health, Proctitis (2008), Shingles (2012), Special screening for malignant neoplasms, colon, and Thyroid disease (2005).    PAST SURGICAL HISTORY:  Past Surgical History:  Procedure Laterality Date  . BREAST BIOPSY Right 07/03/2020   [place] BX. q clip,[facility]  . BREAST BIOPSY Right 07/03/2020   Stereo Bx, x-clip, [facility]  . BREAST EXCISIONAL BIOPSY Right 1988   Fibroadenoma  . BREAST LUMPECTOMY,RADIO FREQ LOCALIZER,AXILLARY SENTINEL LYMPH NODE BIOPSY Right 07/18/2020   Procedure: BREAST LUMPECTOMY,RADIO FREQ LOCALIZER,AXILLARY SENTINEL LYMPH NODE BIOPSY;  Surgeon: Ronny Bacon, MD;  Location: [facility] ORS;  Service: General;  Laterality: Right;  . BREAST RECONSTRUCTION Left   . BREAST SURGERY Left 1987   mastectomy  . CATARACT EXTRACTION    . COLONOSCOPY  2012   hx of colon polyps, Dr. Bary Castilla  . COLONOSCOPY WITH PROPOFOL N/A 11/24/2016   Procedure: COLONOSCOPY WITH PROPOFOL;  Surgeon: Robert Bellow, MD;  Location: [facility] ENDOSCOPY;  Service: Endoscopy;  Laterality: N/A;  . MASTECTOMY Left 1987   breast ca  . TUBAL LIGATION      FAMILY HISTORY: family history includes Breast cancer in her sister; Cancer in her sister and sister; Cancer (age of onset: 79) in her sister.  SOCIAL HISTORY:  reports that she has quit smoking. Her smoking use included cigarettes. She has a 20.00 pack-year smoking history. She has never used smokeless tobacco. She reports that she does not drink alcohol and does not use drugs.  ALLERGIES: Neosporin [neomycin-bacitracin zn-polymyx]  MEDICATIONS:  Current Outpatient Medications  Medication Sig Dispense Refill  . acetaminophen (TYLENOL) 500 MG tablet Take 500 mg by mouth  every 6 (six) hours as needed (for pain).    Marland Kitchen amLODipine (NORVASC) 10 MG tablet Take 10 mg by mouth daily.    . cholecalciferol (VITAMIN D3) 25 MCG (1000 UNIT) tablet Take 1,000 Units by mouth every evening.     . citalopram (CELEXA) 10 MG tablet Take 10 mg by mouth every evening.    . Glucosamine-Chondroitin (COSAMIN DS PO) Take 1 tablet by mouth in the morning and at bedtime.    Marland Kitchen KLOR-CON M20 20 MEQ tablet Take 20 mEq by mouth 2 (two) times daily.    Marland Kitchen levothyroxine (SYNTHROID, LEVOTHROID) 50 MCG tablet Take 25-50 mcg by mouth See admin instructions. Take 0.5 tablet (25 mcg) by mouth every other day alternating with taking 1 tablet (50 mcg) by mouth every other day in the morning before breakfast    . Multiple Vitamin (MULTIVITAMIN WITH MINERALS) TABS tablet Take 1 tablet by mouth every evening.    . pantoprazole (PROTONIX) 40 MG tablet Take 40 mg by mouth daily.    Vladimir Faster Glycol-Propyl Glycol 0.4-0.3 % SOLN Place 1 drop into both eyes See admin instructions. Instill one drop into eyes scheduled at bedtime, may use during the day if needed for irritation.    . simvastatin (ZOCOR) 20 MG tablet Take 20 mg by mouth every evening.    . triamterene-hydrochlorothiazide (MAXZIDE) 75-50 MG per tablet Take 0.5 tablets by mouth daily.     No current facility-administered medications for this encounter.    ECOG PERFORMANCE STATUS:  0 - Asymptomatic  REVIEW OF SYSTEMS: Patient denies any weight loss, fatigue, weakness, fever, chills or night sweats. Patient denies any loss of vision, blurred vision. Patient denies any ringing  of the ears or hearing loss. No irregular heartbeat. Patient denies heart murmur or history of fainting. Patient denies any chest pain or pain radiating to her upper extremities. Patient denies any shortness of breath, difficulty breathing at night, cough or hemoptysis. Patient denies any swelling in the lower legs. Patient denies any nausea vomiting, vomiting of blood, or coffee ground material in the vomitus. Patient denies any stomach pain. Patient states has had normal bowel movements no significant constipation or diarrhea. Patient denies any dysuria, hematuria or significant  nocturia. Patient denies any problems walking, swelling in the joints or loss of balance. Patient denies any skin changes, loss of hair or loss of weight. Patient denies any excessive worrying or anxiety or significant depression. Patient denies any problems with insomnia. Patient denies excessive thirst, polyuria, polydipsia. Patient denies any swollen glands, patient denies easy bruising or easy bleeding. Patient denies any recent infections, allergies or URI. Patient "s visual fields have not changed significantly in recent time.   PHYSICAL EXAM: BP (!) 153/74   Pulse 61   Temp (!) 96.5 F (35.8 C) (Tympanic)   Wt 158 lb (71.7 kg)   BMI 29.85 kg/m  Patient is status post partial mastectomy of the left left breast.  No dominant mass is noted in that region.  Right breast has wide local excision quadrantectomy scar which is healing well.  No dominant masses noted in the right breast no axillary or supraclavicular adenopathy is identified.  Well-developed well-nourished patient in NAD. HEENT reveals PERLA, EOMI, discs not visualized.  Oral cavity is clear. No oral mucosal lesions are identified. Neck is clear without evidence of cervical or supraclavicular adenopathy. Lungs are clear to A&P. Cardiac examination is essentially unremarkable with regular rate and rhythm without murmur rub or thrill. Abdomen is benign  with no organomegaly or masses noted. Motor sensory and DTR levels are equal and symmetric in the upper and lower extremities. Cranial nerves II through XII are grossly intact. Proprioception is intact. No peripheral adenopathy or edema is identified. No motor or sensory levels are noted. Crude visual fields are within normal range.  LABORATORY DATA: Pathology results reviewed    RADIOLOGY RESULTS: Mammogram and ultrasound reviewed compatible with above-stated findings   IMPRESSION: Stage IIa invasive mammary carcinoma the right breast status post partial mastectomy and sentinel lymph  node biopsy ER/PR positive HER-2 negative in 75 year old female  PLAN: Present time patient is resistant to any further adjuvant treatment.  She is specifically and adamantly declined chemotherapy although will see Dr. Janese Banks after me today for that discussion.  I have recommended based on limited sentinel node biopsy and positive 1 out of 3 nodes to treat her whole breast and peripheral lymphatics to 5040 cGy in 28 fractions.  Also boost her scar another 1600 centigrade based on the close at less than 2 mm DCIS margin.  Risks and benefits of treatment including skin reaction fatigue alteration of blood counts possible inclusion of superficial lung and slight chance of lymphedema right upper extremity all were discussed in detail with the patient and her husband.  I have personally set up and ordered CT simulation for next week.  Patient will benefit from adjuvant antiestrogen therapy after completion of radiation.  I would like to take this opportunity to thank you for allowing me to participate in the care of your patient.Noreene Filbert, MD

## 2020-08-05 ENCOUNTER — Ambulatory Visit
Admission: RE | Admit: 2020-08-05 | Discharge: 2020-08-05 | Disposition: A | Discharge status: Home / Self Care | Payer: Medicare Other | Source: Ambulatory Visit | Attending: Radiation Oncology | Admitting: Radiation Oncology

## 2020-08-05 DIAGNOSIS — Z510 Encounter for antineoplastic radiation therapy: Secondary | ICD-10-CM | POA: Insufficient documentation

## 2020-08-05 DIAGNOSIS — C50911 Malignant neoplasm of unspecified site of right female breast: Secondary | ICD-10-CM | POA: Diagnosis not present

## 2020-08-07 DIAGNOSIS — Z510 Encounter for antineoplastic radiation therapy: Secondary | ICD-10-CM | POA: Diagnosis not present

## 2020-08-08 ENCOUNTER — Other Ambulatory Visit: Payer: Self-pay | Admitting: *Deleted

## 2020-08-08 DIAGNOSIS — Z170 Estrogen receptor positive status [ER+]: Secondary | ICD-10-CM

## 2020-08-12 ENCOUNTER — Ambulatory Visit: Admission: RE | Admit: 2020-08-12 | Payer: Medicare Other | Source: Ambulatory Visit

## 2020-08-12 DIAGNOSIS — Z510 Encounter for antineoplastic radiation therapy: Secondary | ICD-10-CM | POA: Diagnosis not present

## 2020-08-13 ENCOUNTER — Ambulatory Visit
Admission: RE | Admit: 2020-08-13 | Discharge: 2020-08-13 | Disposition: A | Discharge status: Home / Self Care | Payer: Medicare Other | Source: Ambulatory Visit | Attending: Radiation Oncology | Admitting: Radiation Oncology

## 2020-08-13 DIAGNOSIS — Z510 Encounter for antineoplastic radiation therapy: Secondary | ICD-10-CM | POA: Diagnosis not present

## 2020-08-14 ENCOUNTER — Ambulatory Visit
Admission: RE | Admit: 2020-08-14 | Discharge: 2020-08-14 | Disposition: A | Discharge status: Home / Self Care | Payer: Medicare Other | Source: Ambulatory Visit | Attending: Radiation Oncology | Admitting: Radiation Oncology

## 2020-08-14 DIAGNOSIS — Z510 Encounter for antineoplastic radiation therapy: Secondary | ICD-10-CM | POA: Diagnosis not present

## 2020-08-15 ENCOUNTER — Ambulatory Visit
Admission: RE | Admit: 2020-08-15 | Discharge: 2020-08-15 | Disposition: A | Discharge status: Home / Self Care | Payer: Medicare Other | Source: Ambulatory Visit | Attending: Radiation Oncology | Admitting: Radiation Oncology

## 2020-08-15 DIAGNOSIS — Z510 Encounter for antineoplastic radiation therapy: Secondary | ICD-10-CM | POA: Diagnosis not present

## 2020-08-18 ENCOUNTER — Ambulatory Visit
Admission: RE | Admit: 2020-08-18 | Discharge: 2020-08-18 | Disposition: A | Discharge status: Home / Self Care | Payer: Medicare Other | Source: Ambulatory Visit | Attending: Radiation Oncology | Admitting: Radiation Oncology

## 2020-08-18 DIAGNOSIS — Z510 Encounter for antineoplastic radiation therapy: Secondary | ICD-10-CM | POA: Diagnosis not present

## 2020-08-19 ENCOUNTER — Ambulatory Visit
Admission: RE | Admit: 2020-08-19 | Discharge: 2020-08-19 | Disposition: A | Discharge status: Home / Self Care | Payer: Medicare Other | Source: Ambulatory Visit | Attending: Radiation Oncology | Admitting: Radiation Oncology

## 2020-08-19 DIAGNOSIS — Z510 Encounter for antineoplastic radiation therapy: Secondary | ICD-10-CM | POA: Diagnosis not present

## 2020-08-20 ENCOUNTER — Ambulatory Visit
Admission: RE | Admit: 2020-08-20 | Discharge: 2020-08-20 | Disposition: A | Discharge status: Home / Self Care | Payer: Medicare Other | Source: Ambulatory Visit | Attending: Radiation Oncology | Admitting: Radiation Oncology

## 2020-08-20 DIAGNOSIS — Z510 Encounter for antineoplastic radiation therapy: Secondary | ICD-10-CM | POA: Diagnosis not present

## 2020-08-21 ENCOUNTER — Ambulatory Visit
Admission: RE | Admit: 2020-08-21 | Discharge: 2020-08-21 | Disposition: A | Discharge status: Home / Self Care | Payer: Medicare Other | Source: Ambulatory Visit | Attending: Radiation Oncology | Admitting: Radiation Oncology

## 2020-08-21 DIAGNOSIS — Z510 Encounter for antineoplastic radiation therapy: Secondary | ICD-10-CM | POA: Diagnosis not present

## 2020-08-22 ENCOUNTER — Ambulatory Visit
Admission: RE | Admit: 2020-08-22 | Discharge: 2020-08-22 | Disposition: A | Discharge status: Home / Self Care | Payer: Medicare Other | Source: Ambulatory Visit | Attending: Radiation Oncology | Admitting: Radiation Oncology

## 2020-08-22 DIAGNOSIS — Z510 Encounter for antineoplastic radiation therapy: Secondary | ICD-10-CM | POA: Diagnosis not present

## 2020-08-25 ENCOUNTER — Ambulatory Visit
Admission: RE | Admit: 2020-08-25 | Discharge: 2020-08-25 | Disposition: A | Discharge status: Home / Self Care | Payer: Medicare Other | Source: Ambulatory Visit | Attending: Radiation Oncology | Admitting: Radiation Oncology

## 2020-08-25 DIAGNOSIS — Z510 Encounter for antineoplastic radiation therapy: Secondary | ICD-10-CM | POA: Diagnosis not present

## 2020-08-26 ENCOUNTER — Ambulatory Visit
Admission: RE | Admit: 2020-08-26 | Discharge: 2020-08-26 | Disposition: A | Discharge status: Home / Self Care | Payer: Medicare Other | Source: Ambulatory Visit | Attending: Radiation Oncology | Admitting: Radiation Oncology

## 2020-08-26 DIAGNOSIS — Z510 Encounter for antineoplastic radiation therapy: Secondary | ICD-10-CM | POA: Diagnosis not present

## 2020-08-27 ENCOUNTER — Ambulatory Visit
Admission: RE | Admit: 2020-08-27 | Discharge: 2020-08-27 | Disposition: A | Discharge status: Home / Self Care | Payer: Medicare Other | Source: Ambulatory Visit | Attending: Radiation Oncology | Admitting: Radiation Oncology

## 2020-08-27 DIAGNOSIS — Z510 Encounter for antineoplastic radiation therapy: Secondary | ICD-10-CM | POA: Diagnosis not present

## 2020-08-28 ENCOUNTER — Ambulatory Visit
Admission: RE | Admit: 2020-08-28 | Discharge: 2020-08-28 | Disposition: A | Discharge status: Home / Self Care | Payer: Medicare Other | Source: Ambulatory Visit | Attending: Radiation Oncology | Admitting: Radiation Oncology

## 2020-08-28 ENCOUNTER — Inpatient Hospital Stay: Payer: Medicare Other | Attending: Oncology

## 2020-08-28 DIAGNOSIS — C50411 Malignant neoplasm of upper-outer quadrant of right female breast: Secondary | ICD-10-CM | POA: Insufficient documentation

## 2020-08-28 DIAGNOSIS — Z510 Encounter for antineoplastic radiation therapy: Secondary | ICD-10-CM | POA: Diagnosis not present

## 2020-08-28 DIAGNOSIS — Z170 Estrogen receptor positive status [ER+]: Secondary | ICD-10-CM | POA: Diagnosis not present

## 2020-08-28 LAB — CBC
HCT: 40.9 % (ref 36.0–46.0)
Hemoglobin: 13.6 g/dL (ref 12.0–15.0)
MCH: 28.3 pg (ref 26.0–34.0)
MCHC: 33.3 g/dL (ref 30.0–36.0)
MCV: 85.2 fL (ref 80.0–100.0)
Platelets: 262 10*3/uL (ref 150–400)
RBC: 4.8 MIL/uL (ref 3.87–5.11)
RDW: 13.3 % (ref 11.5–15.5)
WBC: 6.2 10*3/uL (ref 4.0–10.5)
nRBC: 0 % (ref 0.0–0.2)

## 2020-08-29 ENCOUNTER — Ambulatory Visit
Admission: RE | Admit: 2020-08-29 | Discharge: 2020-08-29 | Disposition: A | Discharge status: Home / Self Care | Payer: Medicare Other | Source: Ambulatory Visit | Attending: Radiation Oncology | Admitting: Radiation Oncology

## 2020-08-29 DIAGNOSIS — Z510 Encounter for antineoplastic radiation therapy: Secondary | ICD-10-CM | POA: Diagnosis not present

## 2020-09-01 ENCOUNTER — Ambulatory Visit
Admission: RE | Admit: 2020-09-01 | Discharge: 2020-09-01 | Disposition: A | Discharge status: Home / Self Care | Payer: Medicare Other | Source: Ambulatory Visit | Attending: Radiation Oncology | Admitting: Radiation Oncology

## 2020-09-01 DIAGNOSIS — Z510 Encounter for antineoplastic radiation therapy: Secondary | ICD-10-CM | POA: Diagnosis not present

## 2020-09-02 ENCOUNTER — Ambulatory Visit
Admission: RE | Admit: 2020-09-02 | Discharge: 2020-09-02 | Disposition: A | Discharge status: Home / Self Care | Payer: Medicare Other | Source: Ambulatory Visit | Attending: Radiation Oncology | Admitting: Radiation Oncology

## 2020-09-02 DIAGNOSIS — Z510 Encounter for antineoplastic radiation therapy: Secondary | ICD-10-CM | POA: Diagnosis not present

## 2020-09-03 ENCOUNTER — Ambulatory Visit
Admission: RE | Admit: 2020-09-03 | Discharge: 2020-09-03 | Disposition: A | Discharge status: Home / Self Care | Payer: Medicare Other | Source: Ambulatory Visit | Attending: Radiation Oncology | Admitting: Radiation Oncology

## 2020-09-03 DIAGNOSIS — Z510 Encounter for antineoplastic radiation therapy: Secondary | ICD-10-CM | POA: Diagnosis not present

## 2020-09-04 ENCOUNTER — Ambulatory Visit
Admission: RE | Admit: 2020-09-04 | Discharge: 2020-09-04 | Disposition: A | Discharge status: Home / Self Care | Payer: Medicare Other | Source: Ambulatory Visit | Attending: Radiation Oncology | Admitting: Radiation Oncology

## 2020-09-04 DIAGNOSIS — Z510 Encounter for antineoplastic radiation therapy: Secondary | ICD-10-CM | POA: Diagnosis not present

## 2020-09-05 ENCOUNTER — Ambulatory Visit
Admission: RE | Admit: 2020-09-05 | Discharge: 2020-09-05 | Disposition: A | Discharge status: Home / Self Care | Payer: Medicare Other | Source: Ambulatory Visit | Attending: Radiation Oncology | Admitting: Radiation Oncology

## 2020-09-05 DIAGNOSIS — Z510 Encounter for antineoplastic radiation therapy: Secondary | ICD-10-CM | POA: Diagnosis not present

## 2020-09-05 DIAGNOSIS — C50911 Malignant neoplasm of unspecified site of right female breast: Secondary | ICD-10-CM | POA: Insufficient documentation

## 2020-09-08 ENCOUNTER — Ambulatory Visit
Admission: RE | Admit: 2020-09-08 | Discharge: 2020-09-08 | Disposition: A | Discharge status: Home / Self Care | Payer: Medicare Other | Source: Ambulatory Visit | Attending: Radiation Oncology | Admitting: Radiation Oncology

## 2020-09-08 DIAGNOSIS — Z510 Encounter for antineoplastic radiation therapy: Secondary | ICD-10-CM | POA: Diagnosis not present

## 2020-09-09 ENCOUNTER — Ambulatory Visit
Admission: RE | Admit: 2020-09-09 | Discharge: 2020-09-09 | Disposition: A | Discharge status: Home / Self Care | Payer: Medicare Other | Source: Ambulatory Visit | Attending: Radiation Oncology | Admitting: Radiation Oncology

## 2020-09-09 DIAGNOSIS — Z510 Encounter for antineoplastic radiation therapy: Secondary | ICD-10-CM | POA: Diagnosis not present

## 2020-09-10 ENCOUNTER — Ambulatory Visit
Admission: RE | Admit: 2020-09-10 | Discharge: 2020-09-10 | Disposition: A | Discharge status: Home / Self Care | Payer: Medicare Other | Source: Ambulatory Visit | Attending: Radiation Oncology | Admitting: Radiation Oncology

## 2020-09-10 DIAGNOSIS — Z510 Encounter for antineoplastic radiation therapy: Secondary | ICD-10-CM | POA: Diagnosis not present

## 2020-09-11 ENCOUNTER — Ambulatory Visit
Admission: RE | Admit: 2020-09-11 | Discharge: 2020-09-11 | Disposition: A | Discharge status: Home / Self Care | Payer: Medicare Other | Source: Ambulatory Visit | Attending: Radiation Oncology | Admitting: Radiation Oncology

## 2020-09-11 ENCOUNTER — Inpatient Hospital Stay: Payer: Medicare Other | Attending: Oncology

## 2020-09-11 DIAGNOSIS — Z79811 Long term (current) use of aromatase inhibitors: Secondary | ICD-10-CM | POA: Diagnosis not present

## 2020-09-11 DIAGNOSIS — C50411 Malignant neoplasm of upper-outer quadrant of right female breast: Secondary | ICD-10-CM | POA: Diagnosis present

## 2020-09-11 DIAGNOSIS — Z170 Estrogen receptor positive status [ER+]: Secondary | ICD-10-CM | POA: Insufficient documentation

## 2020-09-11 DIAGNOSIS — Z510 Encounter for antineoplastic radiation therapy: Secondary | ICD-10-CM | POA: Diagnosis not present

## 2020-09-11 LAB — CBC
HCT: 41.9 % (ref 36.0–46.0)
Hemoglobin: 13.8 g/dL (ref 12.0–15.0)
MCH: 28.4 pg (ref 26.0–34.0)
MCHC: 32.9 g/dL (ref 30.0–36.0)
MCV: 86.2 fL (ref 80.0–100.0)
Platelets: 241 10*3/uL (ref 150–400)
RBC: 4.86 MIL/uL (ref 3.87–5.11)
RDW: 13.5 % (ref 11.5–15.5)
WBC: 4.9 10*3/uL (ref 4.0–10.5)
nRBC: 0 % (ref 0.0–0.2)

## 2020-09-12 ENCOUNTER — Ambulatory Visit
Admission: RE | Admit: 2020-09-12 | Discharge: 2020-09-12 | Disposition: A | Discharge status: Home / Self Care | Payer: Medicare Other | Source: Ambulatory Visit | Attending: Radiation Oncology | Admitting: Radiation Oncology

## 2020-09-12 DIAGNOSIS — Z510 Encounter for antineoplastic radiation therapy: Secondary | ICD-10-CM | POA: Diagnosis not present

## 2020-09-15 ENCOUNTER — Ambulatory Visit
Admission: RE | Admit: 2020-09-15 | Discharge: 2020-09-15 | Disposition: A | Discharge status: Home / Self Care | Payer: Medicare Other | Source: Ambulatory Visit | Attending: Radiation Oncology | Admitting: Radiation Oncology

## 2020-09-15 DIAGNOSIS — Z510 Encounter for antineoplastic radiation therapy: Secondary | ICD-10-CM | POA: Diagnosis not present

## 2020-09-16 ENCOUNTER — Ambulatory Visit
Admission: RE | Admit: 2020-09-16 | Discharge: 2020-09-16 | Disposition: A | Discharge status: Home / Self Care | Payer: Medicare Other | Source: Ambulatory Visit | Attending: Radiation Oncology | Admitting: Radiation Oncology

## 2020-09-16 DIAGNOSIS — Z510 Encounter for antineoplastic radiation therapy: Secondary | ICD-10-CM | POA: Diagnosis not present

## 2020-09-17 ENCOUNTER — Ambulatory Visit
Admission: RE | Admit: 2020-09-17 | Discharge: 2020-09-17 | Disposition: A | Discharge status: Home / Self Care | Payer: Medicare Other | Source: Ambulatory Visit | Attending: Radiation Oncology | Admitting: Radiation Oncology

## 2020-09-17 DIAGNOSIS — Z510 Encounter for antineoplastic radiation therapy: Secondary | ICD-10-CM | POA: Diagnosis not present

## 2020-09-18 ENCOUNTER — Ambulatory Visit
Admission: RE | Admit: 2020-09-18 | Discharge: 2020-09-18 | Disposition: A | Discharge status: Home / Self Care | Payer: Medicare Other | Source: Ambulatory Visit | Attending: Radiation Oncology | Admitting: Radiation Oncology

## 2020-09-18 DIAGNOSIS — Z510 Encounter for antineoplastic radiation therapy: Secondary | ICD-10-CM | POA: Diagnosis not present

## 2020-09-19 ENCOUNTER — Ambulatory Visit
Admission: RE | Admit: 2020-09-19 | Discharge: 2020-09-19 | Disposition: A | Discharge status: Home / Self Care | Payer: Medicare Other | Source: Ambulatory Visit | Attending: Radiation Oncology | Admitting: Radiation Oncology

## 2020-09-19 DIAGNOSIS — Z510 Encounter for antineoplastic radiation therapy: Secondary | ICD-10-CM | POA: Diagnosis not present

## 2020-09-22 ENCOUNTER — Ambulatory Visit
Admission: RE | Admit: 2020-09-22 | Discharge: 2020-09-22 | Disposition: A | Discharge status: Home / Self Care | Payer: Medicare Other | Source: Ambulatory Visit | Attending: Radiation Oncology | Admitting: Radiation Oncology

## 2020-09-22 DIAGNOSIS — Z510 Encounter for antineoplastic radiation therapy: Secondary | ICD-10-CM | POA: Diagnosis not present

## 2020-09-23 ENCOUNTER — Ambulatory Visit
Admission: RE | Admit: 2020-09-23 | Discharge: 2020-09-23 | Disposition: A | Discharge status: Home / Self Care | Payer: Medicare Other | Source: Ambulatory Visit | Attending: Radiation Oncology | Admitting: Radiation Oncology

## 2020-09-23 DIAGNOSIS — Z510 Encounter for antineoplastic radiation therapy: Secondary | ICD-10-CM | POA: Diagnosis not present

## 2020-09-24 ENCOUNTER — Ambulatory Visit
Admission: RE | Admit: 2020-09-24 | Discharge: 2020-09-24 | Disposition: A | Discharge status: Home / Self Care | Payer: Medicare Other | Source: Ambulatory Visit | Attending: Radiation Oncology | Admitting: Radiation Oncology

## 2020-09-24 DIAGNOSIS — Z510 Encounter for antineoplastic radiation therapy: Secondary | ICD-10-CM | POA: Diagnosis not present

## 2020-09-25 ENCOUNTER — Ambulatory Visit
Admission: RE | Admit: 2020-09-25 | Discharge: 2020-09-25 | Disposition: A | Discharge status: Home / Self Care | Payer: Medicare Other | Source: Ambulatory Visit | Attending: Radiation Oncology | Admitting: Radiation Oncology

## 2020-09-25 ENCOUNTER — Inpatient Hospital Stay: Payer: Medicare Other

## 2020-09-25 DIAGNOSIS — C50411 Malignant neoplasm of upper-outer quadrant of right female breast: Secondary | ICD-10-CM

## 2020-09-25 DIAGNOSIS — Z170 Estrogen receptor positive status [ER+]: Secondary | ICD-10-CM

## 2020-09-25 DIAGNOSIS — Z510 Encounter for antineoplastic radiation therapy: Secondary | ICD-10-CM | POA: Diagnosis not present

## 2020-09-25 LAB — CBC
HCT: 41 % (ref 36.0–46.0)
Hemoglobin: 13.6 g/dL (ref 12.0–15.0)
MCH: 28.6 pg (ref 26.0–34.0)
MCHC: 33.2 g/dL (ref 30.0–36.0)
MCV: 86.1 fL (ref 80.0–100.0)
Platelets: 222 K/uL (ref 150–400)
RBC: 4.76 MIL/uL (ref 3.87–5.11)
RDW: 13.2 % (ref 11.5–15.5)
WBC: 5 K/uL (ref 4.0–10.5)
nRBC: 0 % (ref 0.0–0.2)

## 2020-09-26 ENCOUNTER — Ambulatory Visit
Admission: RE | Admit: 2020-09-26 | Discharge: 2020-09-26 | Disposition: A | Discharge status: Home / Self Care | Payer: Medicare Other | Source: Ambulatory Visit | Attending: Radiation Oncology | Admitting: Radiation Oncology

## 2020-09-26 DIAGNOSIS — Z510 Encounter for antineoplastic radiation therapy: Secondary | ICD-10-CM | POA: Diagnosis not present

## 2020-09-29 ENCOUNTER — Ambulatory Visit
Admission: RE | Admit: 2020-09-29 | Discharge: 2020-09-29 | Disposition: A | Discharge status: Home / Self Care | Payer: Medicare Other | Source: Ambulatory Visit | Attending: Radiation Oncology | Admitting: Radiation Oncology

## 2020-09-29 DIAGNOSIS — Z510 Encounter for antineoplastic radiation therapy: Secondary | ICD-10-CM | POA: Diagnosis not present

## 2020-09-30 ENCOUNTER — Ambulatory Visit
Admission: RE | Admit: 2020-09-30 | Discharge: 2020-09-30 | Disposition: A | Discharge status: Home / Self Care | Payer: Medicare Other | Source: Ambulatory Visit | Attending: Radiation Oncology | Admitting: Radiation Oncology

## 2020-09-30 DIAGNOSIS — Z510 Encounter for antineoplastic radiation therapy: Secondary | ICD-10-CM | POA: Diagnosis not present

## 2020-10-01 ENCOUNTER — Ambulatory Visit
Admission: RE | Admit: 2020-10-01 | Discharge: 2020-10-01 | Disposition: A | Discharge status: Home / Self Care | Payer: Medicare Other | Source: Ambulatory Visit | Attending: Radiation Oncology | Admitting: Radiation Oncology

## 2020-10-01 DIAGNOSIS — Z510 Encounter for antineoplastic radiation therapy: Secondary | ICD-10-CM | POA: Diagnosis not present

## 2020-10-28 ENCOUNTER — Ambulatory Visit
Admission: RE | Admit: 2020-10-28 | Discharge: 2020-10-28 | Disposition: A | Discharge status: Home / Self Care | Payer: Medicare Other | Source: Ambulatory Visit | Attending: Oncology | Admitting: Oncology

## 2020-10-28 ENCOUNTER — Other Ambulatory Visit: Payer: Self-pay

## 2020-10-28 DIAGNOSIS — Z79811 Long term (current) use of aromatase inhibitors: Secondary | ICD-10-CM | POA: Insufficient documentation

## 2020-10-28 DIAGNOSIS — C50911 Malignant neoplasm of unspecified site of right female breast: Secondary | ICD-10-CM | POA: Diagnosis not present

## 2020-10-30 ENCOUNTER — Other Ambulatory Visit: Payer: Medicare Other

## 2020-10-30 ENCOUNTER — Ambulatory Visit: Payer: Medicare Other | Admitting: Oncology

## 2020-10-31 ENCOUNTER — Encounter: Payer: Self-pay | Admitting: Oncology

## 2020-10-31 ENCOUNTER — Inpatient Hospital Stay: Payer: Medicare Other | Admitting: Oncology

## 2020-10-31 ENCOUNTER — Other Ambulatory Visit: Payer: Self-pay | Admitting: *Deleted

## 2020-10-31 ENCOUNTER — Other Ambulatory Visit: Payer: Self-pay

## 2020-10-31 ENCOUNTER — Inpatient Hospital Stay: Payer: Medicare Other | Attending: Oncology

## 2020-10-31 ENCOUNTER — Inpatient Hospital Stay: Payer: Medicare Other

## 2020-10-31 VITALS — BP 145/79 | HR 97 | Temp 98.8°F | Wt 161.0 lb

## 2020-10-31 DIAGNOSIS — Z79899 Other long term (current) drug therapy: Secondary | ICD-10-CM | POA: Diagnosis not present

## 2020-10-31 DIAGNOSIS — Z9012 Acquired absence of left breast and nipple: Secondary | ICD-10-CM | POA: Diagnosis not present

## 2020-10-31 DIAGNOSIS — C50411 Malignant neoplasm of upper-outer quadrant of right female breast: Secondary | ICD-10-CM

## 2020-10-31 DIAGNOSIS — Z87891 Personal history of nicotine dependence: Secondary | ICD-10-CM | POA: Insufficient documentation

## 2020-10-31 DIAGNOSIS — Z853 Personal history of malignant neoplasm of breast: Secondary | ICD-10-CM | POA: Insufficient documentation

## 2020-10-31 DIAGNOSIS — M810 Age-related osteoporosis without current pathological fracture: Secondary | ICD-10-CM | POA: Insufficient documentation

## 2020-10-31 DIAGNOSIS — Z5181 Encounter for therapeutic drug level monitoring: Secondary | ICD-10-CM | POA: Diagnosis not present

## 2020-10-31 DIAGNOSIS — Z170 Estrogen receptor positive status [ER+]: Secondary | ICD-10-CM

## 2020-10-31 DIAGNOSIS — Z79811 Long term (current) use of aromatase inhibitors: Secondary | ICD-10-CM | POA: Diagnosis not present

## 2020-10-31 DIAGNOSIS — I10 Essential (primary) hypertension: Secondary | ICD-10-CM | POA: Diagnosis not present

## 2020-10-31 DIAGNOSIS — E785 Hyperlipidemia, unspecified: Secondary | ICD-10-CM | POA: Insufficient documentation

## 2020-10-31 DIAGNOSIS — C50911 Malignant neoplasm of unspecified site of right female breast: Secondary | ICD-10-CM

## 2020-10-31 DIAGNOSIS — E039 Hypothyroidism, unspecified: Secondary | ICD-10-CM | POA: Insufficient documentation

## 2020-10-31 LAB — COMPREHENSIVE METABOLIC PANEL
ALT: 15 U/L (ref 0–44)
AST: 19 U/L (ref 15–41)
Albumin: 4.4 g/dL (ref 3.5–5.0)
Alkaline Phosphatase: 87 U/L (ref 38–126)
Anion gap: 12 (ref 5–15)
BUN: 12 mg/dL (ref 8–23)
CO2: 26 mmol/L (ref 22–32)
Calcium: 9.3 mg/dL (ref 8.9–10.3)
Chloride: 95 mmol/L — ABNORMAL LOW (ref 98–111)
Creatinine, Ser: 0.6 mg/dL (ref 0.44–1.00)
GFR, Estimated: >60 mL/min (ref >60)
Glucose, Bld: 106 mg/dL — ABNORMAL HIGH (ref 70–99)
Potassium: 3.6 mmol/L (ref 3.5–5.1)
Sodium: 133 mmol/L — ABNORMAL LOW (ref 135–145)
Total Bilirubin: 0.7 mg/dL (ref 0.3–1.2)
Total Protein: 7.4 g/dL (ref 6.5–8.1)

## 2020-10-31 LAB — CBC WITH DIFFERENTIAL/PLATELET
Abs Immature Granulocytes: 0.02 10*3/uL (ref 0.00–0.07)
Basophils Absolute: 0 10*3/uL (ref 0.0–0.1)
Basophils Relative: 1 %
Eosinophils Absolute: 0.1 10*3/uL (ref 0.0–0.5)
Eosinophils Relative: 2 %
HCT: 40.8 % (ref 36.0–46.0)
Hemoglobin: 13.5 g/dL (ref 12.0–15.0)
Immature Granulocytes: 0 %
Lymphocytes Relative: 16 %
Lymphs Abs: 0.9 10*3/uL (ref 0.7–4.0)
MCH: 28.1 pg (ref 26.0–34.0)
MCHC: 33.1 g/dL (ref 30.0–36.0)
MCV: 85 fL (ref 80.0–100.0)
Monocytes Absolute: 0.6 10*3/uL (ref 0.1–1.0)
Monocytes Relative: 10 %
Neutro Abs: 4 10*3/uL (ref 1.7–7.7)
Neutrophils Relative %: 71 %
Platelets: 275 10*3/uL (ref 150–400)
RBC: 4.8 MIL/uL (ref 3.87–5.11)
RDW: 12.9 % (ref 11.5–15.5)
WBC: 5.6 10*3/uL (ref 4.0–10.5)
nRBC: 0 % (ref 0.0–0.2)

## 2020-10-31 MED ORDER — ALENDRONATE SODIUM 70 MG PO TABS: 70.0000 mg | ORAL_TABLET | ORAL | 3 refills | Status: DC

## 2020-10-31 MED ORDER — TAMOXIFEN CITRATE 20 MG PO TABS: 20.0000 mg | ORAL_TABLET | Freq: Every day | ORAL | 3 refills | Status: DC

## 2020-10-31 NOTE — Progress Notes (Signed)
fosamax

## 2020-10-31 NOTE — Progress Notes (Signed)
Hematology/Oncology Consult note [facility]  Telephone:([phone] [phone]  Patient Care Team: Maryland Pink, MD as PCP - General (Family Medicine) Bary Castilla, Forest Gleason, MD (General Surgery) Maryland Pink, MD as Referring Physician (Family Medicine) Maryland Pink, MD as Referring Physician (Family Medicine) Sindy Guadeloupe, MD as Consulting Physician (Oncology)   Name of the patient: [place] Nolton  [id number]  07/31/1945   Date of visit: 10/31/20  Diagnosis- pathological prognostic stage Ia invasive mammary carcinoma of the right breast pT1 cpN1 acM0 ER/PR positive HER-2 negative s/p lumpectomy  Chief complaint/ Reason for visit- discuss bone density scan results and further management  Heme/Onc history: Patient is a 75 year old female with a past medical history significant for hypertension hyperlipidemia hypothyroidism among other medical problems. She recently underwent a screening right breast mammogram which showed 2 possible areas of distortion in the right breast. This was followed by an ultrasound which showed a hypoechoic mass in the right breast measuring 1.3 x 1 x 0.9 cm at the 10 o'clock position 5 cm from the nipple. No sonographic correlate for the secondary of distortion. Simple cyst of 0.5 cm at 9 o'clock position. Normal-appearing right axillary lymph nodes. Patient underwent biopsy of the primary breast mass which showed invasive mammary carcinoma 7 mm grade 2. The other mass without sonographic correlate was also biopsied and was consistent with grade 1 invasive mammary carcinoma. ER/PR positive and HER-2 negative   Patient underwent Lumpectomy by Dr. Christian Mate.  Final pathology showed multifocal invasive mammary carcinoma 11 mm with negative margins.  1 out of 2 sentinel lymph nodes was positive for malignancy.  PT1CPN1A   Interval history-patient is tolerating Arimidex well without any significant side effects.  She  reports no specific complaints today  ECOG PS- 1 Pain scale- 0   Review of systems- Review of Systems  Constitutional: Negative for chills, fever, malaise/fatigue and weight loss.  HENT: Negative for congestion, ear discharge and nosebleeds.   Eyes: Negative for blurred vision.  Respiratory: Negative for cough, hemoptysis, sputum production, shortness of breath and wheezing.   Cardiovascular: Negative for chest pain, palpitations, orthopnea and claudication.  Gastrointestinal: Negative for abdominal pain, blood in stool, constipation, diarrhea, heartburn, melena, nausea and vomiting.  Genitourinary: Negative for dysuria, flank pain, frequency, hematuria and urgency.  Musculoskeletal: Negative for back pain, joint pain and myalgias.  Skin: Negative for rash.  Neurological: Negative for dizziness, tingling, focal weakness, seizures, weakness and headaches.  Endo/Heme/Allergies: Does not bruise/bleed easily.  Psychiatric/Behavioral: Negative for depression and suicidal ideas. The patient does not have insomnia.        Allergies  Allergen Reactions  . Neosporin [Neomycin-Bacitracin Zn-Polymyx] Itching     Past Medical History:  Diagnosis Date  . Allergy   . Anxiety   . Breast cancer ([place]) 1987  . Breast cancer ([place]) 2022  . Cancer ([place]) 12/13/1985   LCIS, simple mastectomy, 3 cm area of LCIS, immediate reconstruction.  Marland Kitchen GERD (gastroesophageal reflux disease)   . Hyperlipidemia   . Hypertension   . Hypothyroidism   . Personal history of colonic polyps    2013: Sessile adenoma of ascending colon, Tubular adenoma at 25 cm; 2008: Adenomatous polyp at 30 cm and rectum.1994: Adenomatous polyp at 30 cm;  1992: Adenomatous polyp at 25 cm  . Personal history of tobacco use, presenting hazards to health   . Proctitis 2008  . Shingles 2012  . Special screening for malignant neoplasms, colon   . Thyroid disease 2005  hypothyroidism     Past Surgical History:  Procedure  Laterality Date  . BREAST BIOPSY Right 07/03/2020   [place] BX. q clip,[facility]  . BREAST BIOPSY Right 07/03/2020   Stereo Bx, x-clip, [facility]  . BREAST EXCISIONAL BIOPSY Right 1988   Fibroadenoma  . BREAST LUMPECTOMY,RADIO FREQ LOCALIZER,AXILLARY SENTINEL LYMPH NODE BIOPSY Right 07/18/2020   Procedure: BREAST LUMPECTOMY,RADIO FREQ LOCALIZER,AXILLARY SENTINEL LYMPH NODE BIOPSY;  Surgeon: Ronny Bacon, MD;  Location: [facility] ORS;  Service: General;  Laterality: Right;  . BREAST RECONSTRUCTION Left   . BREAST SURGERY Left 1987   mastectomy  . CATARACT EXTRACTION    . COLONOSCOPY  2012   hx of colon polyps, Dr. Bary Castilla  . COLONOSCOPY WITH PROPOFOL N/A 11/24/2016   Procedure: COLONOSCOPY WITH PROPOFOL;  Surgeon: Robert Bellow, MD;  Location: [facility] ENDOSCOPY;  Service: Endoscopy;  Laterality: N/A;  . MASTECTOMY Left 1987   breast ca  . TUBAL LIGATION      Social History   Socioeconomic History  . Marital status: Married    Spouse name: Not on file  . Number of children: Not on file  . Years of education: Not on file  . Highest education level: Not on file  Occupational History  . Not on file  Tobacco Use  . Smoking status: Former Smoker    Packs/day: 1.00    Years: 20.00    Pack years: 20.00    Types: Cigarettes  . Smokeless tobacco: Never Used  Vaping Use  . Vaping Use: Never used  Substance and Sexual Activity  . Alcohol use: No  . Drug use: No  . Sexual activity: Not Currently  Other Topics Concern  . Not on file  Social History Narrative  . Not on file   Social Determinants of Health   Financial Resource Strain: Not on file  Food Insecurity: Not on file  Transportation Needs: Not on file  Physical Activity: Not on file  Stress: Not on file  Social Connections: Not on file  Intimate Partner Violence: Not on file    Family History  Problem Relation Age of Onset  . Cancer Sister   . Breast cancer Sister   . Cancer Sister 25       uterine  . Cancer Sister         colon cancer     Current Outpatient Medications:  .  acetaminophen (TYLENOL) 500 MG tablet, Take 500 mg by mouth every 6 (six) hours as needed (for pain)., Disp: , Rfl:  .  amLODipine (NORVASC) 10 MG tablet, Take 10 mg by mouth daily., Disp: , Rfl:  .  anastrozole (ARIMIDEX) 1 MG tablet, Take 1 tablet (1 mg total) by mouth daily., Disp: 30 tablet, Rfl: 2 .  cholecalciferol (VITAMIN D3) 25 MCG (1000 UNIT) tablet, Take 1,000 Units by mouth every evening., Disp: , Rfl:  .  citalopram (CELEXA) 10 MG tablet, Take 10 mg by mouth every evening., Disp: , Rfl:  .  Glucosamine-Chondroitin (COSAMIN DS PO), Take 1 tablet by mouth in the morning and at bedtime., Disp: , Rfl:  .  KLOR-CON M20 20 MEQ tablet, Take 20 mEq by mouth 2 (two) times daily., Disp: , Rfl:  .  levothyroxine (SYNTHROID, LEVOTHROID) 50 MCG tablet, Take 25-50 mcg by mouth See admin instructions. Take 0.5 tablet (25 mcg) by mouth every other day alternating with taking 1 tablet (50 mcg) by mouth every other day in the morning before breakfast, Disp: , Rfl:  .  Multiple Vitamin (  MULTIVITAMIN WITH MINERALS) TABS tablet, Take 1 tablet by mouth every evening., Disp: , Rfl:  .  pantoprazole (PROTONIX) 40 MG tablet, Take 40 mg by mouth daily., Disp: , Rfl:  .  Polyethyl Glycol-Propyl Glycol 0.4-0.3 % SOLN, Place 1 drop into both eyes See admin instructions. Instill one drop into eyes scheduled at bedtime, may use during the day if needed for irritation., Disp: , Rfl:  .  alendronate (FOSAMAX) 70 MG tablet, Take 1 tablet (70 mg total) by mouth once a week. Take with a full glass of water on an empty stomach., Disp: 4 tablet, Rfl: 3 .  simvastatin (ZOCOR) 20 MG tablet, Take 20 mg by mouth every evening., Disp: , Rfl:  .  tamoxifen (NOLVADEX) 20 MG tablet, Take 1 tablet (20 mg total) by mouth daily., Disp: 30 tablet, Rfl: 3 .  triamterene-hydrochlorothiazide (MAXZIDE) 75-50 MG per tablet, Take 0.5 tablets by mouth daily., Disp: , Rfl:   Physical  exam:  Vitals:   10/31/20 1152  BP: (!) 145/79  Pulse: 97  Temp: 98.8 F (37.1 C)  TempSrc: Tympanic  SpO2: 99%  Weight: 161 lb (73 kg)   Physical Exam Constitutional:      General: She is not in acute distress. Cardiovascular:     Rate and Rhythm: Normal rate and regular rhythm.     Heart sounds: Normal heart sounds.  Pulmonary:     Effort: Pulmonary effort is normal.     Breath sounds: Normal breath sounds.  Skin:    General: Skin is warm and dry.  Neurological:     Mental Status: She is alert and oriented to person, place, and time.      CMP Latest Ref Rng & Units 10/31/2020  Glucose 70 - 99 mg/dL 106(H)  BUN 8 - 23 mg/dL 12  Creatinine 0.44 - 1.00 mg/dL 0.60  Sodium 135 - 145 mmol/L 133(L)  Potassium 3.5 - 5.1 mmol/L 3.6  Chloride 98 - 111 mmol/L 95(L)  CO2 22 - 32 mmol/L 26  Calcium 8.9 - 10.3 mg/dL 9.3  Total Protein 6.5 - 8.1 g/dL 7.4  Total Bilirubin 0.3 - 1.2 mg/dL 0.7  Alkaline Phos 38 - 126 U/L 87  AST 15 - 41 U/L 19  ALT 0 - 44 U/L 15   CBC Latest Ref Rng & Units 10/31/2020  WBC 4.0 - 10.5 K/uL 5.6  Hemoglobin 12.0 - 15.0 g/dL 13.5  Hematocrit 36.0 - 46.0 % 40.8  Platelets 150 - 400 K/uL 275    No images are attached to the encounter.  DG Bone Density  Result Date: 10/28/2020 EXAM: DUAL X-RAY ABSORPTIOMETRY (DXA) FOR BONE MINERAL DENSITY IMPRESSION: Your patient Celestia Plancarte completed a BMD test on 10/28/2020 using the [employer] (software version: 14.10) manufactured by [employer]. The following summarizes the results of our evaluation. Technologist:VLM PATIENT BIOGRAPHICAL: Name: Elfrida, Pixley Patient ID: [id number] Birth Date: [birth year] Height: 61.0 in. Gender: Female Exam Date: 10/28/2020 Weight: 160.0 lbs. Indications: Caucasian, History of Breast Cancer, History of Radiation, Hyperthyroid Fractures: Treatments: Arimidex, Calcium, Vitamin D DENSITOMETRY RESULTS: Site         Region     Measured Date Measured Age WHO  Classification Young Adult T-score BMD         %Change vs. Previous Significant Change (*) DualFemur Neck Right 10/28/[age] Osteoporosis -2.6 0.679 g/cm2 - - Left Forearm Radius 33% 10/28/2020 77.1 Osteoporosis -4.5 0.478 g/cm2 - - ASSESSMENT: The BMD measured at Forearm Radius  33% is 0.478 g/cm2 with a T-score of -4.5. This patient is considered osteoporotic according to [facility]) criteria. Lumbar spine was not utilized due to advanced degenerative changes and scoliosis. The scan quality is good. World Pharmacologist [facility]) criteria for post-menopausal, Caucasian Women: Normal:                   T-score at or above -1 SD Osteopenia/low bone mass: T-score between -1 and -2.5 SD Osteoporosis:             T-score at or below -2.5 SD RECOMMENDATIONS: 1. All patients should optimize calcium and vitamin D intake. 2. Consider FDA-approved medical therapies in postmenopausal women and men aged 62 years and older, based on the following: a. A hip or vertebral(clinical or morphometric) fracture b. T-score < -2.5 at the femoral neck or spine after appropriate evaluation to exclude secondary causes c. Low bone mass (T-score between -1.0 and -2.5 at the femoral neck or spine) and a 10-year probability of a hip fracture > 3% or a 10-year probability of a major osteoporosis-related fracture > 20% based on the [place]-adapted WHO algorithm 3. Clinician judgment and/or patient preferences may indicate treatment for people with 10-year fracture probabilities above or below these levels FOLLOW-UP: People with diagnosed cases of osteoporosis or at high risk for fracture should have regular bone mineral density tests. For patients eligible for Medicare, routine testing is allowed once every 2 years. The testing frequency can be increased to one year for patients who have rapidly progressing disease, those who are receiving or discontinuing medical therapy to restore bone mass, or have additional risk factors. I  have reviewed this report, and agree with the above findings. [facility] Electronically Signed   By: Rolm Baptise M.D.   On: 10/28/2020 11:29     Assessment and plan- Patient is a 75 y.o. female with h/o diagnosed invasive mammary carcinoma of the right breast pathological prognostic stage I apT1 cpN1 acM0 ER/PR positive and HER-2 negative s/p lumpectomy.  She is currently on Arimidex and this is a routine follow-up visit  Patient's recent bone density scan shows that she has osteoporosis with a T score of -4.6 in her left forearm.Patient is 26 and I am concerned that Arimidex could potentially make her bone health worse.  I have reiterated that patient should take calcium 1200 mg along with vitamin D 800 international units.  Given her osteoporosis it would be reasonable to consider bisphosphonates at this time.  I discussed both weekly Fosamax versus parenteral bisphosphonates.  She prefers to try oral bisphosphonates first and we will start her on Fosamax weekly.  Discussed risks and benefits of Fosamax including all but not limited to nausea, reflux esophagitis.  Discussed the need to take Fosamax in an upright position and not combine it with other pills.  Patient verbalized understanding  I would also like to switch her from Arimidex to tamoxifen given the risk of worsening bone health in the setting of stage I breast cancer.  Discussed risks and benefits of tamoxifen including all but not limited to hot flashes, fatigue, risk of uterine cancer and blood clots.  Patient has already had her cataract surgeries done.  I will see her back in 4 months   Visit Diagnosis 1. Age-related osteoporosis without current pathological fracture   2. Visit for monitoring Arimidex therapy   3. Malignant neoplasm of upper-outer quadrant of right breast in female, estrogen receptor positive ([place])  Dr. Randa Evens, MD, MPH [facility] at [facility] [phone] 10/31/2020 7:28  PM

## 2020-11-10 ENCOUNTER — Ambulatory Visit
Admission: RE | Admit: 2020-11-10 | Discharge: 2020-11-10 | Disposition: A | Discharge status: Home / Self Care | Payer: Medicare Other | Source: Ambulatory Visit | Attending: Radiation Oncology | Admitting: Radiation Oncology

## 2020-11-10 VITALS — BP 139/81 | HR 98 | Temp 97.6°F | Wt 160.0 lb

## 2020-11-10 DIAGNOSIS — Z79810 Long term (current) use of selective estrogen receptor modulators (SERMs): Secondary | ICD-10-CM | POA: Insufficient documentation

## 2020-11-10 DIAGNOSIS — C50411 Malignant neoplasm of upper-outer quadrant of right female breast: Secondary | ICD-10-CM | POA: Diagnosis not present

## 2020-11-10 DIAGNOSIS — Z170 Estrogen receptor positive status [ER+]: Secondary | ICD-10-CM | POA: Insufficient documentation

## 2020-11-10 DIAGNOSIS — R21 Rash and other nonspecific skin eruption: Secondary | ICD-10-CM | POA: Insufficient documentation

## 2020-11-10 DIAGNOSIS — Z923 Personal history of irradiation: Secondary | ICD-10-CM | POA: Insufficient documentation

## 2020-11-10 NOTE — Progress Notes (Signed)
Radiation Oncology Follow up Note  Name: Erin Nolan   Date:   11/10/2020 MRN:  [id number] DOB: 06/24/1945    This 75 y.o. 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  REFERRING PROVIDER: Maryland Pink, MD  HPI: Patient is a 75 year old female now out 1 month having completed whole breast radiation.  As well as peripheral lymphatics.  Seen today in routine follow-up she has a slight rash in the right supra [place] region consistent with skin reaction resolving.  She otherwise specifically denies breast tenderness cough or bone pain.  She is currently on tamoxifen and Fosamax under medical oncology's direction.  Tolerating that well.  COMPLICATIONS OF TREATMENT: none  FOLLOW UP COMPLIANCE: keeps appointments   PHYSICAL EXAM:  BP 139/81   Pulse 98   Temp 97.6 F (36.4 C) (Tympanic)   Wt 160 lb (72.6 kg)   BMI 30.23 kg/m  Patient is status post bilateral breast surgery.  No dominant masses noted in either breast.  She does have some resolving skin changes in the right supraclavicular region.  Well-developed well-nourished patient in NAD. HEENT reveals PERLA, EOMI, discs not visualized.  Oral cavity is clear. No oral mucosal lesions are identified. Neck is clear without evidence of cervical or supraclavicular adenopathy. Lungs are clear to A&P. Cardiac examination is essentially unremarkable with regular rate and rhythm without murmur rub or thrill. Abdomen is benign with no organomegaly or masses noted. Motor sensory and DTR levels are equal and symmetric in the upper and lower extremities. Cranial nerves II through XII are grossly intact. Proprioception is intact. No peripheral adenopathy or edema is identified. No motor or sensory levels are noted. Crude visual fields are within normal range.  RADIOLOGY RESULTS:  No current films for review  PLAN: Present time patient is doing well 1 month out.  She will continue using some cortisone cream for her slight skin reaction.  Otherwise have asked to see her back in 4 to 5 months for follow-up.  She continues tamoxifen as well as Fosamax.  Patient knows to call with any concerns.  I would like to take this opportunity to thank you for allowing me to participate in the care of your patient.Noreene Filbert, MD

## 2020-11-20 ENCOUNTER — Telehealth: Payer: Self-pay | Admitting: *Deleted

## 2020-11-20 NOTE — Telephone Encounter (Signed)
Called the pt. To make sure if pt is getting local meds  10/31/2020 -alendronate, and tamoxifen and she said yes for 1 month and then she wants it to go through mail order [facility]. I filled out paper and faxed it to [facility]

## 2021-03-03 ENCOUNTER — Encounter: Payer: Self-pay | Admitting: Nurse Practitioner

## 2021-03-03 ENCOUNTER — Inpatient Hospital Stay: Payer: Medicare Other | Attending: Oncology

## 2021-03-03 ENCOUNTER — Inpatient Hospital Stay: Payer: Medicare Other | Admitting: Nurse Practitioner

## 2021-03-03 VITALS — BP 140/69 | HR 91 | Temp 98.5°F | Resp 16 | Wt 164.2 lb

## 2021-03-03 DIAGNOSIS — Z79899 Other long term (current) drug therapy: Secondary | ICD-10-CM | POA: Diagnosis not present

## 2021-03-03 DIAGNOSIS — M818 Other osteoporosis without current pathological fracture: Secondary | ICD-10-CM | POA: Insufficient documentation

## 2021-03-03 DIAGNOSIS — Z5181 Encounter for therapeutic drug level monitoring: Secondary | ICD-10-CM | POA: Diagnosis not present

## 2021-03-03 DIAGNOSIS — C50911 Malignant neoplasm of unspecified site of right female breast: Secondary | ICD-10-CM

## 2021-03-03 DIAGNOSIS — M810 Age-related osteoporosis without current pathological fracture: Secondary | ICD-10-CM

## 2021-03-03 DIAGNOSIS — Z08 Encounter for follow-up examination after completed treatment for malignant neoplasm: Secondary | ICD-10-CM

## 2021-03-03 DIAGNOSIS — Z170 Estrogen receptor positive status [ER+]: Secondary | ICD-10-CM | POA: Insufficient documentation

## 2021-03-03 DIAGNOSIS — C50411 Malignant neoplasm of upper-outer quadrant of right female breast: Secondary | ICD-10-CM | POA: Diagnosis not present

## 2021-03-03 DIAGNOSIS — E039 Hypothyroidism, unspecified: Secondary | ICD-10-CM | POA: Diagnosis not present

## 2021-03-03 DIAGNOSIS — Z87891 Personal history of nicotine dependence: Secondary | ICD-10-CM | POA: Diagnosis not present

## 2021-03-03 DIAGNOSIS — Z79810 Long term (current) use of selective estrogen receptor modulators (SERMs): Secondary | ICD-10-CM | POA: Diagnosis not present

## 2021-03-03 DIAGNOSIS — Z853 Personal history of malignant neoplasm of breast: Secondary | ICD-10-CM

## 2021-03-03 DIAGNOSIS — Z79811 Long term (current) use of aromatase inhibitors: Secondary | ICD-10-CM

## 2021-03-03 LAB — COMPREHENSIVE METABOLIC PANEL
ALT: 14 U/L (ref 0–44)
AST: 19 U/L (ref 15–41)
Albumin: 4.4 g/dL (ref 3.5–5.0)
Alkaline Phosphatase: 48 U/L (ref 38–126)
Anion gap: 10 (ref 5–15)
BUN: 16 mg/dL (ref 8–23)
CO2: 26 mmol/L (ref 22–32)
Calcium: 9.5 mg/dL (ref 8.9–10.3)
Chloride: 98 mmol/L (ref 98–111)
Creatinine, Ser: 0.67 mg/dL (ref 0.44–1.00)
GFR, Estimated: >60 mL/min (ref >60)
Glucose, Bld: 106 mg/dL — ABNORMAL HIGH (ref 70–99)
Potassium: 3.9 mmol/L (ref 3.5–5.1)
Sodium: 134 mmol/L — ABNORMAL LOW (ref 135–145)
Total Bilirubin: 0.6 mg/dL (ref 0.3–1.2)
Total Protein: 7.6 g/dL (ref 6.5–8.1)

## 2021-03-03 MED ORDER — ALENDRONATE SODIUM 70 MG PO TABS: 70.0000 mg | ORAL_TABLET | ORAL | 3 refills | Status: DC

## 2021-03-03 MED ORDER — TAMOXIFEN CITRATE 20 MG PO TABS: 20.0000 mg | ORAL_TABLET | Freq: Every day | ORAL | 3 refills | Status: DC

## 2021-03-03 NOTE — Progress Notes (Signed)
Hematology/Oncology Consult Note [facility]  Telephone:([phone] [phone]  Patient Care Team: Maryland Pink, MD as PCP - General (Family Medicine) Bary Castilla, Forest Gleason, MD (General Surgery) Maryland Pink, MD as Referring Physician (Family Medicine) Maryland Pink, MD as Referring Physician (Family Medicine) Noreene Filbert, MD as Referring Physician (Radiation Oncology) Sindy Guadeloupe, MD as Consulting Physician (Oncology)   Name of the patient: Erin Nolan  [id number]  1945/12/13   Date of visit: 03/03/21  Diagnosis- pathological prognostic stage Ia invasive mammary carcinoma of the right breast pT1 cpN1 acM0 ER/PR positive HER-2 negative s/p lumpectomy  Chief complaint/ Reason for visit- Breast Cancer Surveillance  Heme/Onc history: Patient is a 75 year old female with a past medical history significant for hypertension hyperlipidemia hypothyroidism among other medical problems.  She recently underwent a screening right breast mammogram which showed 2 possible areas of distortion in the right breast.  This was followed by an ultrasound which showed a hypoechoic mass in the right breast measuring 1.3 x 1 x 0.9 cm at the 10 o'clock position 5 cm from the nipple.  No sonographic correlate for the secondary of distortion.  Simple cyst of 0.5 cm at 9 o'clock position.  Normal-appearing right axillary lymph nodes.  Patient underwent biopsy of the primary breast mass which showed invasive mammary carcinoma 7 mm grade 2.  The other mass without sonographic correlate was also biopsied and was consistent with grade 1 invasive mammary carcinoma.  ER/PR positive and HER-2 negative    Patient underwent Lumpectomy by Dr. Christian Mate.  Final pathology showed multifocal invasive mammary carcinoma 11 mm with negative margins.  1 out of 2 sentinel lymph nodes was positive for malignancy.  PT1CPN1A  Bone density scan showed T score of -4.6 in her left forearm  consistent with osteoporosis.  She was switched from Arimidex to tamoxifen given risk of possibly worsening bone health.  She started Fosamax weekly given desire for oral bisphosphonate.  Interval history- Patient is 75 year old female who returns to clinic for labs, and continued surveillance of history of breast cancer. She continues tamoxifen and fosamax. Tolerating well without significant side effects. No new lumps or bumps. No breast pain, skin changes, or discharge. She denies specific complaints.   ECOG PS- 1 Pain scale- 0  Review of systems- Review of Systems  Constitutional:  Negative for chills, fever, malaise/fatigue and weight loss.  HENT:  Negative for congestion, ear discharge and nosebleeds.   Eyes:  Negative for blurred vision.  Respiratory:  Negative for cough, hemoptysis, sputum production, shortness of breath and wheezing.   Cardiovascular:  Negative for chest pain, palpitations, orthopnea and claudication.  Gastrointestinal:  Negative for abdominal pain, blood in stool, constipation, diarrhea, heartburn, melena, nausea and vomiting.  Genitourinary:  Negative for dysuria, flank pain, frequency, hematuria and urgency.  Musculoskeletal:  Negative for back pain, joint pain and myalgias.  Skin:  Negative for rash.  Neurological:  Negative for dizziness, tingling, focal weakness, seizures, weakness and headaches.  Endo/Heme/Allergies:  Does not bruise/bleed easily.  Psychiatric/Behavioral:  Negative for depression and suicidal ideas. The patient does not have insomnia.      Allergies  Allergen Reactions   Neosporin [Neomycin-Bacitracin Zn-Polymyx] Itching    Past Medical History:  Diagnosis Date   Allergy    Anxiety    Breast cancer ([place]) 1987   Breast cancer ([place]) 2022   Cancer ([place]) 12/13/1985   LCIS, simple mastectomy, 3 cm area of LCIS, immediate reconstruction.   GERD (gastroesophageal  reflux disease)    Hyperlipidemia    Hypertension    Hypothyroidism     Personal history of colonic polyps    2013: Sessile adenoma of ascending colon, Tubular adenoma at 25 cm; 2008: Adenomatous polyp at 30 cm and rectum.1994: Adenomatous polyp at 30 cm;  1992: Adenomatous polyp at 25 cm   Personal history of tobacco use, presenting hazards to health    Proctitis 2008   Shingles 2012   Special screening for malignant neoplasms, colon    Thyroid disease 2005   hypothyroidism    Past Surgical History:  Procedure Laterality Date   BREAST BIOPSY Right 07/03/2020   [place] BX. q clip,[facility]   BREAST BIOPSY Right 07/03/2020   [place]reo Bx, x-clip, [place]   BREAST EXCISIONAL BIOPSY Right 1988   Fibroadenoma   BREAST LUMPECTOMY,RADIO FREQ LOCALIZER,AXILLARY SENTINEL LYMPH NODE BIOPSY Right 07/18/2020   Procedure: BREAST LUMPECTOMY,RADIO FREQ LOCALIZER,AXILLARY SENTINEL LYMPH NODE BIOPSY;  Surgeon: Ronny Bacon, MD;  Location: [facility] ORS;  Service: General;  Laterality: Right;   BREAST RECONSTRUCTION Left    BREAST SURGERY Left 1987   mastectomy   CATARACT EXTRACTION     COLONOSCOPY  2012   hx of colon polyps, Dr. Bary Castilla   COLONOSCOPY WITH PROPOFOL N/A 11/24/2016   Procedure: COLONOSCOPY WITH PROPOFOL;  Surgeon: Robert Bellow, MD;  Location: [facility] ENDOSCOPY;  Service: Endoscopy;  Laterality: N/A;   MASTECTOMY Left 1987   breast ca   TUBAL LIGATION      Social History   Socioeconomic History   Marital status: Married    Spouse name: Not on file   Number of children: Not on file   Years of education: Not on file   Highest education level: Not on file  Occupational History   Not on file  Tobacco Use   Smoking status: Former    Packs/day: 1.00    Years: 20.00    Pack years: 20.00    Types: Cigarettes   Smokeless tobacco: Never  Vaping Use   Vaping Use: Never used  Substance and Sexual Activity   Alcohol use: No   Drug use: No   Sexual activity: Not Currently  Other Topics Concern   Not on file  Social History Narrative   Not on file   Social  Determinants of Health   Financial Resource Strain: Not on file  Food Insecurity: Not on file  Transportation Needs: Not on file  Physical Activity: Not on file  Stress: Not on file  Social Connections: Not on file  Intimate Partner Violence: Not on file    Family History  Problem Relation Age of Onset   Cancer Sister    Breast cancer Sister    Cancer Sister 45       uterine   Cancer Sister        colon cancer    Current Outpatient Medications:    amLODipine (NORVASC) 10 MG tablet, Take 1 tablet by mouth daily., Disp: , Rfl:    calcium carbonate (CALCIUM 600) 600 MG TABS tablet, , Disp: , Rfl:    pantoprazole (PROTONIX) 40 MG tablet, Take 1 tablet by mouth daily., Disp: , Rfl:    simvastatin (ZOCOR) 20 MG tablet, Take 1 tablet by mouth daily., Disp: , Rfl:    acetaminophen (TYLENOL) 500 MG tablet, Take 500 mg by mouth every 6 (six) hours as needed (for pain)., Disp: , Rfl:    alendronate (FOSAMAX) 70 MG tablet, Take 1 tablet (70 mg total) by mouth  once a week. Take with a full glass of water on an empty stomach., Disp: 4 tablet, Rfl: 3   cholecalciferol (VITAMIN D3) 25 MCG (1000 UNIT) tablet, Take 1,000 Units by mouth every evening., Disp: , Rfl:    citalopram (CELEXA) 10 MG tablet, Take 10 mg by mouth every evening., Disp: , Rfl:    Glucosamine-Chondroitin (COSAMIN DS PO), Take 1 tablet by mouth in the morning and at bedtime., Disp: , Rfl:    KLOR-CON M20 20 MEQ tablet, Take 20 mEq by mouth 2 (two) times daily., Disp: , Rfl:    levothyroxine (SYNTHROID, LEVOTHROID) 50 MCG tablet, Take 25-50 mcg by mouth See admin instructions. Take 0.5 tablet (25 mcg) by mouth every other day alternating with taking 1 tablet (50 mcg) by mouth every other day in the morning before breakfast, Disp: , Rfl:    Multiple Vitamin (MULTIVITAMIN WITH MINERALS) TABS tablet, Take 1 tablet by mouth every evening., Disp: , Rfl:    Polyethyl Glycol-Propyl Glycol 0.4-0.3 % SOLN, Place 1 drop into both eyes See  admin instructions. Instill one drop into eyes scheduled at bedtime, may use during the day if needed for irritation., Disp: , Rfl:    tamoxifen (NOLVADEX) 20 MG tablet, Take 1 tablet (20 mg total) by mouth daily., Disp: 30 tablet, Rfl: 3   triamterene-hydrochlorothiazide (MAXZIDE) 75-50 MG per tablet, Take 0.5 tablets by mouth daily., Disp: , Rfl:   Physical exam:  Vitals:   03/03/21 1115  BP: 140/69  Pulse: 91  Resp: 16  Temp: 98.5 F (36.9 C)  SpO2: 98%  Weight: 164 lb 3.2 oz (74.5 kg)   Physical Exam Constitutional:      General: She is not in acute distress. Cardiovascular:     Rate and Rhythm: Normal rate and regular rhythm.     Heart sounds: Normal heart sounds.  Pulmonary:     Effort: Pulmonary effort is normal.     Breath sounds: Normal breath sounds.  Skin:    General: Skin is warm and dry.  Neurological:     Mental Status: She is alert and oriented to person, place, and time.     CMP Latest Ref Rng & Units 03/03/2021  Glucose 70 - 99 mg/dL 106(H)  BUN 8 - 23 mg/dL 16  Creatinine 0.44 - 1.00 mg/dL 0.67  Sodium 135 - 145 mmol/L 134(L)  Potassium 3.5 - 5.1 mmol/L 3.9  Chloride 98 - 111 mmol/L 98  CO2 22 - 32 mmol/L 26  Calcium 8.9 - 10.3 mg/dL 9.5  Total Protein 6.5 - 8.1 g/dL 7.6  Total Bilirubin 0.3 - 1.2 mg/dL 0.6  Alkaline Phos 38 - 126 U/L 48  AST 15 - 41 U/L 19  ALT 0 - 44 U/L 14   CBC Latest Ref Rng & Units 10/31/2020  WBC 4.0 - 10.5 K/uL 5.6  Hemoglobin 12.0 - 15.0 g/dL 13.5  Hematocrit 36.0 - 46.0 % 40.8  Platelets 150 - 400 K/uL 275   No images are attached to the encounter.  No results found.  Assessment and plan- Patient is a 75 y.o. female with h/o diagnosed invasive mammary carcinoma of the right breast pathological prognostic stage I apT1 cpN1 acM0 ER/PR positive and HER-2 negative s/p lumpectomy, previously on arimidex, now on tamoxifen given osteoporosis. On fosamax weekly for osteoporosis. Tolerating well. Labs reviewed and stable.    Plan for mammogram in December 2022. Clinically NED today.   Plan to repeat bone density in May 2024. She will continue  weekly fosamax.   RTC in 3 months for mammogram then week later, labs and follow up with Dr. Janese Banks   Visit Diagnosis 1. Encounter for follow-up surveillance of breast cancer   2. Encounter for monitoring tamoxifen therapy   3. Osteoporosis without current pathological fracture, unspecified osteoporosis type    Beckey Rutter, DNP, AGNP-C [place] at [facility] [phone] (clinic) 03/03/2021

## 2021-03-03 NOTE — Progress Notes (Signed)
Survivorship Care Plan visit completed.  Treatment summary reviewed and given to patient.  ASCO answers booklet reviewed and given to patient.  CARE program and Cancer Transitions discussed with patient along with other resources cancer center offers to patients and caregivers.  Patient verbalized understanding.    

## 2021-03-03 NOTE — Progress Notes (Signed)
Pt was given eye drops for her cataracts and wants to make sure it does not affect her other medications.

## 2021-03-04 LAB — VITAMIN D 25 HYDROXY (VIT D DEFICIENCY, FRACTURES): Vit D, 25-Hydroxy: 40.88 ng/mL (ref 30–100)

## 2021-04-17 ENCOUNTER — Other Ambulatory Visit: Payer: Self-pay

## 2021-04-17 ENCOUNTER — Ambulatory Visit
Admission: RE | Admit: 2021-04-17 | Discharge: 2021-04-17 | Disposition: A | Discharge status: Home / Self Care | Payer: Medicare Other | Source: Ambulatory Visit | Attending: Radiation Oncology | Admitting: Radiation Oncology

## 2021-04-17 VITALS — BP 128/80 | HR 90 | Temp 97.6°F | Resp 20 | Wt 167.0 lb

## 2021-04-17 DIAGNOSIS — Z79810 Long term (current) use of selective estrogen receptor modulators (SERMs): Secondary | ICD-10-CM | POA: Insufficient documentation

## 2021-04-17 DIAGNOSIS — C50411 Malignant neoplasm of upper-outer quadrant of right female breast: Secondary | ICD-10-CM | POA: Diagnosis present

## 2021-04-17 DIAGNOSIS — Z923 Personal history of irradiation: Secondary | ICD-10-CM | POA: Diagnosis not present

## 2021-04-17 DIAGNOSIS — Z170 Estrogen receptor positive status [ER+]: Secondary | ICD-10-CM | POA: Insufficient documentation

## 2021-04-17 NOTE — Progress Notes (Signed)
Radiation Oncology Follow up Note  Name: Erin Nolan   Date:   04/17/2021 MRN:  [id number] DOB: [birth year]    This 75 y.o. female presents to the clinic today for 38-monthfollow-up status post whole breast radiation to her right breast for stage Ic N1AM0 ER/PR positive HER2 negative invasive mammary carcinoma status post wide local excision. REFERRING PROVIDER: HMaryland Pink MD  HPI: Patient is a 75year old female now out 6 months having completed whole breast radiation to her right breast and peripheral lymphatics.  She is also had reconstruction of her left breast.  She is seen today in routine follow-up is doing well.  She has some nipple inversion on the right which have asked her to start cleaning with some diluted hydrogen peroxide with a Q-tip.  She otherwise is without complaint.  She is currently on tamoxifen tolerating that well..  She is scheduled for mammograms in December.  COMPLICATIONS OF TREATMENT: none  FOLLOW UP COMPLIANCE: keeps appointments   PHYSICAL EXAM:  BP 128/80   Pulse 90   Temp 97.6 F (36.4 C) (Tympanic)   Resp 20   Wt 167 lb (75.8 kg)   BMI 31.55 kg/m  Patient has a reconstructed left breast.  Right breast shows no dominant mass.  The left breast is firm unchanged from prior examination no axillary or supraclavicular adenopathy is appreciated right breast does have an inverted nipple.  No evidence of lymphedema of the right upper extremity is noted.  Well-developed well-nourished patient in NAD. HEENT reveals PERLA, EOMI, discs not visualized.  Oral cavity is clear. No oral mucosal lesions are identified. Neck is clear without evidence of cervical or supraclavicular adenopathy. Lungs are clear to A&P. Cardiac examination is essentially unremarkable with regular rate and rhythm without murmur rub or thrill. Abdomen is benign with no organomegaly or masses noted. Motor sensory and DTR levels are equal and symmetric in the upper and lower extremities.  Cranial nerves II through XII are grossly intact. Proprioception is intact. No peripheral adenopathy or edema is identified. No motor or sensory levels are noted. Crude visual fields are within normal range.  RADIOLOGY RESULTS: No current films for review  PLAN: Present time patient is doing well with no evidence of disease clinically.  On pleased with her overall progress.  Of asked to see her back in 6 months for follow-up.  She continues on tamoxifen.  She already has mammogram scheduled in December.  Patient knows to call with any concerns.  I would like to take this opportunity to thank you for allowing me to participate in the care of your patient..Noreene Filbert MD

## 2021-05-15 ENCOUNTER — Other Ambulatory Visit: Payer: Medicare Other

## 2021-05-29 ENCOUNTER — Other Ambulatory Visit: Payer: Medicare Other

## 2021-06-05 ENCOUNTER — Ambulatory Visit
Admission: RE | Admit: 2021-06-05 | Discharge: 2021-06-05 | Disposition: A | Discharge status: Home / Self Care | Payer: Medicare Other | Source: Ambulatory Visit | Attending: Oncology | Admitting: Oncology

## 2021-06-05 ENCOUNTER — Other Ambulatory Visit: Payer: Self-pay

## 2021-06-05 DIAGNOSIS — Z853 Personal history of malignant neoplasm of breast: Secondary | ICD-10-CM | POA: Diagnosis present

## 2021-06-05 DIAGNOSIS — Z08 Encounter for follow-up examination after completed treatment for malignant neoplasm: Secondary | ICD-10-CM | POA: Diagnosis not present

## 2021-06-05 DIAGNOSIS — M810 Age-related osteoporosis without current pathological fracture: Secondary | ICD-10-CM | POA: Insufficient documentation

## 2021-06-09 ENCOUNTER — Encounter: Payer: Self-pay | Admitting: Oncology

## 2021-06-09 ENCOUNTER — Other Ambulatory Visit: Payer: Self-pay

## 2021-06-09 ENCOUNTER — Inpatient Hospital Stay: Payer: Medicare Other | Attending: Oncology

## 2021-06-09 ENCOUNTER — Inpatient Hospital Stay: Payer: Medicare Other | Admitting: Oncology

## 2021-06-09 VITALS — BP 146/74 | HR 85 | Temp 97.7°F | Resp 16 | Wt 161.0 lb

## 2021-06-09 DIAGNOSIS — Z79810 Long term (current) use of selective estrogen receptor modulators (SERMs): Secondary | ICD-10-CM | POA: Diagnosis not present

## 2021-06-09 DIAGNOSIS — Z08 Encounter for follow-up examination after completed treatment for malignant neoplasm: Secondary | ICD-10-CM | POA: Diagnosis not present

## 2021-06-09 DIAGNOSIS — Z5181 Encounter for therapeutic drug level monitoring: Secondary | ICD-10-CM | POA: Diagnosis not present

## 2021-06-09 DIAGNOSIS — C50411 Malignant neoplasm of upper-outer quadrant of right female breast: Secondary | ICD-10-CM | POA: Insufficient documentation

## 2021-06-09 DIAGNOSIS — Z8049 Family history of malignant neoplasm of other genital organs: Secondary | ICD-10-CM | POA: Diagnosis not present

## 2021-06-09 DIAGNOSIS — Z800 Family history of malignant neoplasm of digestive organs: Secondary | ICD-10-CM | POA: Insufficient documentation

## 2021-06-09 DIAGNOSIS — Z87891 Personal history of nicotine dependence: Secondary | ICD-10-CM | POA: Diagnosis not present

## 2021-06-09 DIAGNOSIS — Z853 Personal history of malignant neoplasm of breast: Secondary | ICD-10-CM

## 2021-06-09 DIAGNOSIS — Z803 Family history of malignant neoplasm of breast: Secondary | ICD-10-CM | POA: Diagnosis not present

## 2021-06-09 DIAGNOSIS — I10 Essential (primary) hypertension: Secondary | ICD-10-CM | POA: Insufficient documentation

## 2021-06-09 DIAGNOSIS — Z170 Estrogen receptor positive status [ER+]: Secondary | ICD-10-CM | POA: Insufficient documentation

## 2021-06-09 LAB — CBC WITH DIFFERENTIAL/PLATELET
Abs Immature Granulocytes: 0.02 10*3/uL (ref 0.00–0.07)
Basophils Absolute: 0.1 10*3/uL (ref 0.0–0.1)
Basophils Relative: 1 %
Eosinophils Absolute: 0.2 10*3/uL (ref 0.0–0.5)
Eosinophils Relative: 3 %
HCT: 41.8 % (ref 36.0–46.0)
Hemoglobin: 13.8 g/dL (ref 12.0–15.0)
Immature Granulocytes: 0 %
Lymphocytes Relative: 18 %
Lymphs Abs: 1.1 10*3/uL (ref 0.7–4.0)
MCH: 29.1 pg (ref 26.0–34.0)
MCHC: 33 g/dL (ref 30.0–36.0)
MCV: 88 fL (ref 80.0–100.0)
Monocytes Absolute: 0.6 10*3/uL (ref 0.1–1.0)
Monocytes Relative: 9 %
Neutro Abs: 4.3 10*3/uL (ref 1.7–7.7)
Neutrophils Relative %: 69 %
Platelets: 233 10*3/uL (ref 150–400)
RBC: 4.75 MIL/uL (ref 3.87–5.11)
RDW: 13.3 % (ref 11.5–15.5)
WBC: 6.2 10*3/uL (ref 4.0–10.5)
nRBC: 0 % (ref 0.0–0.2)

## 2021-06-09 LAB — COMPREHENSIVE METABOLIC PANEL
ALT: 16 U/L (ref 0–44)
AST: 20 U/L (ref 15–41)
Albumin: 4.4 g/dL (ref 3.5–5.0)
Alkaline Phosphatase: 44 U/L (ref 38–126)
Anion gap: 10 (ref 5–15)
BUN: 14 mg/dL (ref 8–23)
CO2: 27 mmol/L (ref 22–32)
Calcium: 9.6 mg/dL (ref 8.9–10.3)
Chloride: 99 mmol/L (ref 98–111)
Creatinine, Ser: 0.81 mg/dL (ref 0.44–1.00)
GFR, Estimated: >60 mL/min (ref >60)
Glucose, Bld: 100 mg/dL — ABNORMAL HIGH (ref 70–99)
Potassium: 4.1 mmol/L (ref 3.5–5.1)
Sodium: 136 mmol/L (ref 135–145)
Total Bilirubin: 0.6 mg/dL (ref 0.3–1.2)
Total Protein: 7.7 g/dL (ref 6.5–8.1)

## 2021-06-09 NOTE — Progress Notes (Signed)
Pt only concern is that sometimes she lays on right shoulder and it causes her pain and can't get good sleep.

## 2021-06-09 NOTE — Progress Notes (Signed)
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  She recently underwent a screening right breast mammogram which showed 2 possible areas of distortion in the right breast.  This was followed by an ultrasound which showed a hypoechoic mass in the right breast measuring 1.3 x 1 x 0.9 cm at the 10 o'clock position 5 cm from the nipple.  No sonographic correlate for the secondary of distortion.  Simple cyst of 0.5 cm at 9 o'clock position.  Normal-appearing right axillary lymph nodes.  Patient underwent biopsy of the primary breast mass which showed invasive mammary carcinoma 7 mm grade 2.  The other mass without sonographic correlate was also biopsied and was consistent with grade 1 invasive mammary carcinoma.  ER/PR positive and HER-2 negative    Patient underwent Lumpectomy by Dr. Christian Mate.  Final pathology showed multifocal invasive mammary carcinoma 11 mm with negative margins.  1 out of 2 sentinel lymph nodes was positive for malignancy.  PT1CPN1A Patient has baseline osteoporosis for which she  is on Fosamax and was switched from Arimidex to tamoxifen  Interval history-patient is currently tolerating tamoxifen Fosamax well without any significant side effects.  Denies any specific breast concerns at this time  ECOG PS- 1   Review of systems- Review of Systems  Constitutional:  Negative for chills, fever, malaise/fatigue and weight loss.  HENT:  Negative for congestion, ear discharge and nosebleeds.   Eyes:  Negative for blurred vision.  Respiratory:  Negative for cough, hemoptysis, sputum production, shortness of breath and wheezing.   Cardiovascular:  Negative for chest pain, palpitations, orthopnea and claudication.  Gastrointestinal:  Negative for abdominal pain, blood in stool, constipation, diarrhea, heartburn, melena, nausea and vomiting.  Genitourinary:  Negative for dysuria, flank pain, frequency, hematuria and urgency.  Musculoskeletal:  Negative for back pain, joint pain and myalgias.  Skin:  Negative for rash.  Neurological:  Negative for dizziness, tingling, focal weakness, seizures, weakness and headaches.  Endo/Heme/Allergies:  Does not bruise/bleed easily.  Psychiatric/Behavioral:  Negative for depression and suicidal ideas. The patient does not have insomnia.    Allergies  Allergen Reactions   Neosporin [Neomycin-Bacitracin Zn-Polymyx] Itching     Past Medical History:  Diagnosis Date   Allergy    Anxiety    Breast cancer ([place]) 1987   Breast cancer ([place]) 2022   Cancer ([place]) 12/13/1985   LCIS, simple mastectomy, 3 cm area of LCIS, immediate reconstruction.   GERD (gastroesophageal reflux disease)    Hyperlipidemia    Hypertension    Hypothyroidism    Personal history of colonic polyps    2013: Sessile adenoma of ascending colon, Tubular adenoma at 25 cm; 2008: Adenomatous polyp at 30 cm and rectum.1994: Adenomatous polyp at 30 cm;  1992: Adenomatous polyp at 25 cm   Personal history of tobacco use, presenting hazards to health    Proctitis 2008    Shingles 2012   Special screening for malignant neoplasms, colon    Thyroid disease 2005   hypothyroidism     Past Surgical History:  Procedure Laterality Date   BREAST BIOPSY Right 07/03/2020   [place] BX. q clip,[facility]   BREAST BIOPSY Right 07/03/2020   Stereo Bx, x-clip, [place]   BREAST EXCISIONAL BIOPSY Right 1988   Fibroadenoma   BREAST LUMPECTOMY,RADIO FREQ LOCALIZER,AXILLARY SENTINEL LYMPH NODE BIOPSY Right 07/18/2020   Procedure: BREAST LUMPECTOMY,RADIO FREQ LOCALIZER,AXILLARY SENTINEL LYMPH NODE BIOPSY;  Surgeon: Ronny Bacon, MD;  Location: [facility] ORS;  Service: General;  Laterality: Right;   BREAST RECONSTRUCTION Left    BREAST SURGERY Left 1987   mastectomy   CATARACT EXTRACTION     COLONOSCOPY  2012   hx of colon polyps, Dr. Bary Castilla   COLONOSCOPY WITH PROPOFOL N/A 11/24/2016   Procedure: COLONOSCOPY WITH PROPOFOL;  Surgeon: Robert Bellow, MD;  Location: [facility] ENDOSCOPY;  Service: Endoscopy;  Laterality: N/A;   MASTECTOMY Left 1987   breast ca   TUBAL LIGATION      Social History   Socioeconomic History   Marital status: Married    Spouse name: Not on file   Number of children: Not on file   Years of education: Not on file   Highest education level: Not on file  Occupational History   Not on file  Tobacco Use   Smoking status: Former    Packs/day: 1.00    Years: 42.00    Pack years: 42.00    Types: Cigarettes   Smokeless tobacco: Never  Vaping Use   Vaping Use: Never used  Substance and Sexual Activity   Alcohol use: No   Drug use: No   Sexual activity: Not Currently  Other Topics Concern   Not on file  Social History Narrative   Not on file   Social Determinants of Health   Financial Resource Strain: Not on file  Food Insecurity: Not on file  Transportation Needs: Not on file  Physical Activity: Not on file  Stress: Not on file  Social Connections: Not on file  Intimate Partner Violence: Not on file    Family History  Problem Relation Age of  Onset   Cancer Sister    Breast cancer Sister    Cancer Sister 82       uterine   Cancer Sister        colon cancer     Current Outpatient Medications:    acetaminophen (TYLENOL) 500 MG tablet, Take 500 mg by mouth every 6 (six) hours as needed (for pain)., Disp: , Rfl:    alendronate (FOSAMAX) 70 MG tablet, Take 1 tablet (70 mg total) by mouth once a week. Take with a full glass of water on an empty stomach., Disp: 12 tablet, Rfl: 3   amLODipine (NORVASC) 10 MG tablet, Take 1 tablet by mouth daily., Disp: , Rfl:    calcium carbonate (OS-CAL) 600 MG TABS tablet, 600 mg daily with breakfast., Disp: , Rfl:    cholecalciferol (VITAMIN D3) 25 MCG (1000 UNIT) tablet, Take 1,000 Units by mouth every evening., Disp: , Rfl:    citalopram (CELEXA) 10 MG tablet, Take 10 mg by mouth every evening., Disp: , Rfl:    Glucosamine-Chondroitin (COSAMIN DS PO), Take 1 tablet by mouth in the morning and at bedtime., Disp: , Rfl:  KLOR-CON M20 20 MEQ tablet, Take 20 mEq by mouth 2 (two) times daily., Disp: , Rfl:    levothyroxine (SYNTHROID, LEVOTHROID) 50 MCG tablet, Take 25-50 mcg by mouth See admin instructions. Take 0.5 tablet (25 mcg) by mouth every other day alternating with taking 1 tablet (50 mcg) by mouth every other day in the morning before breakfast, Disp: , Rfl:    Multiple Vitamin (MULTIVITAMIN WITH MINERALS) TABS tablet, Take 1 tablet by mouth every evening., Disp: , Rfl:    pantoprazole (PROTONIX) 40 MG tablet, Take 1 tablet by mouth daily., Disp: , Rfl:    simvastatin (ZOCOR) 20 MG tablet, Take 1 tablet by mouth daily., Disp: , Rfl:    tamoxifen (NOLVADEX) 20 MG tablet, Take 1 tablet (20 mg total) by mouth daily., Disp: 90 tablet, Rfl: 3   triamterene-hydrochlorothiazide (MAXZIDE) 75-50 MG per tablet, Take 0.5 tablets by mouth daily., Disp: , Rfl:   Physical exam:  Vitals:   06/09/21 1135  BP: (!) 146/74  Pulse: 85  Resp: 16  Temp: 97.7 F (36.5 C)  TempSrc: Tympanic  Weight: 161 lb  (73 kg)   Physical Exam Constitutional:      General: She is not in acute distress. Cardiovascular:     Rate and Rhythm: Normal rate and regular rhythm.     Heart sounds: Normal heart sounds.  Pulmonary:     Effort: Pulmonary effort is normal.     Breath sounds: Normal breath sounds.  Abdominal:     General: Bowel sounds are normal.     Palpations: Abdomen is soft.  Skin:    General: Skin is warm and dry.  Neurological:     Mental Status: She is alert and oriented to person, place, and time.  Patient is s/p left mastectomy with reconstruction.  Breast implants in place.  No evidence of chest wall recurrence.  Patient is s/p right lumpectomy with a well-healed surgical scar and induration seen around the scar.  No palpable lymphadenopathy.  No other palpable masses in the right breast  CMP Latest Ref Rng & Units 06/09/2021  Glucose 70 - 99 mg/dL 100(H)  BUN 8 - 23 mg/dL 14  Creatinine 0.44 - 1.00 mg/dL 0.81  Sodium 135 - 145 mmol/L 136  Potassium 3.5 - 5.1 mmol/L 4.1  Chloride 98 - 111 mmol/L 99  CO2 22 - 32 mmol/L 27  Calcium 8.9 - 10.3 mg/dL 9.6  Total Protein 6.5 - 8.1 g/dL 7.7  Total Bilirubin 0.3 - 1.2 mg/dL 0.6  Alkaline Phos 38 - 126 U/L 44  AST 15 - 41 U/L 20  ALT 0 - 44 U/L 16   CBC Latest Ref Rng & Units 06/09/2021  WBC 4.0 - 10.5 K/uL 6.2  Hemoglobin 12.0 - 15.0 g/dL 13.8  Hematocrit 36.0 - 46.0 % 41.8  Platelets 150 - 400 K/uL 233    No images are attached to the encounter.  MM DIAG BREAST TOMO UNI RIGHT  Result Date: 06/05/2021 CLINICAL DATA:  Patient with history of right breast lumpectomy 2022. EXAM: DIGITAL DIAGNOSTIC UNILATERAL RIGHT MAMMOGRAM WITH TOMOSYNTHESIS AND CAD TECHNIQUE: Right digital diagnostic mammography and breast tomosynthesis was performed. The images were evaluated with computer-aided detection. COMPARISON:  Previous exam(s). ACR Breast Density Category c: The breast tissue is heterogeneously dense, which may obscure small masses. FINDINGS:  Interval postlumpectomy changes right breast. No new masses, calcifications or nonsurgical distortion identified within the right breast. IMPRESSION: Interval postlumpectomy changes right breast. No mammographic evidence for malignancy. RECOMMENDATION: Right  breast diagnostic mammography in 1 year. I have discussed the findings and recommendations with the patient. If applicable, a reminder letter will be sent to the patient regarding the next appointment. BI-RADS CATEGORY  2: Benign. Electronically Signed   By: Lovey Newcomer M.D.   On: 06/05/2021 11:19    Assessment and plan- Patient is a 76 y.o. female  with h/o diagnosed invasive mammary carcinoma of the right breast pathological prognostic stage I apT1 cpN1 acM0 ER/PR positive and HER-2 negative s/p lumpectomy.  She is currently on tamoxifen and this is a routine follow-up visit  Clinically patient is doing well with no symptoms of recurrence based on today's exam.  Her right breast mammogram Did not show any evidence of malignancy and she would be due for a repeat mammogram in 1 year.  Patient has baseline osteoporosis and is currently tolerating Fosamax well without any significant side effects.  Patient will continue taking tamoxifen for her breast cancer for at least 5 years.  I will see her back in 6 months no labs   Visit Diagnosis 1. Encounter for monitoring tamoxifen therapy   2. Encounter for follow-up surveillance of breast cancer      Dr. Randa Evens, MD, MPH [facility] [phone] 06/09/2021 4:20 PM

## 2021-10-15 ENCOUNTER — Encounter: Payer: Self-pay | Admitting: Radiation Oncology

## 2021-10-15 ENCOUNTER — Ambulatory Visit
Admission: RE | Admit: 2021-10-15 | Discharge: 2021-10-15 | Disposition: A | Discharge status: Home / Self Care | Payer: Medicare Other | Source: Ambulatory Visit | Attending: Radiation Oncology | Admitting: Radiation Oncology

## 2021-10-15 VITALS — BP 146/83 | HR 98 | Temp 98.7°F | Resp 20 | Wt 160.3 lb

## 2021-10-15 DIAGNOSIS — Z170 Estrogen receptor positive status [ER+]: Secondary | ICD-10-CM | POA: Insufficient documentation

## 2021-10-15 DIAGNOSIS — Z79810 Long term (current) use of selective estrogen receptor modulators (SERMs): Secondary | ICD-10-CM | POA: Diagnosis not present

## 2021-10-15 DIAGNOSIS — C50411 Malignant neoplasm of upper-outer quadrant of right female breast: Secondary | ICD-10-CM | POA: Diagnosis not present

## 2021-10-15 DIAGNOSIS — Z923 Personal history of irradiation: Secondary | ICD-10-CM | POA: Diagnosis not present

## 2021-10-15 NOTE — Progress Notes (Signed)
Radiation Oncology ?Follow up Note ? ?Name: Erin Nolan   ?Date:   10/15/2021 ?MRN:  [id number] ?DOB: 18-May-1946  ? ? ?This 76 y.o. female presents to the clinic today for 1 year follow-up status post whole breast radiation to right breast for stage Ic N1AM0 ER/PR positive HER2 negative invasive mammary carcinoma status post wide local excision. ? ?REFERRING PROVIDER: Maryland Pink, MD ? ?HPI: Patient is a 76 year old female now out 1 year having completed whole breast radiation to her right breast and peripheral lymphatics.  She has had reconstruction of the left breast.  Seen today in routine follow-up she is doing well specifically denies any breast tenderness cough or bone pain.  She is having no swelling in her upper extremities..  She is currently on tamoxifen tolerating it well without side effect she had mammograms back in December which I have reviewed were BI-RADS 2 benign ? ?COMPLICATIONS OF TREATMENT: none ? ?FOLLOW UP COMPLIANCE: keeps appointments  ? ?PHYSICAL EXAM:  ?BP (!) 146/83   Pulse 98   Temp 98.7 ?F (37.1 ?C)   Resp 20   Wt 160 lb 4.8 oz (72.7 kg)   SpO2 100%   BMI 30.29 kg/m?  ?Left breast is reconstructed with fairly poor surgical outcome.  Right breast is free of dominant mass.  No axillary or supraclavicular adenopathy is identified no evidence of lymphedema is noted.  Well-developed well-nourished patient in NAD. HEENT reveals PERLA, EOMI, discs not visualized.  Oral cavity is clear. No oral mucosal lesions are identified. Neck is clear without evidence of cervical or supraclavicular adenopathy. Lungs are clear to A&P. Cardiac examination is essentially unremarkable with regular rate and rhythm without murmur rub or thrill. Abdomen is benign with no organomegaly or masses noted. Motor sensory and DTR levels are equal and symmetric in the upper and lower extremities. Cranial nerves II through XII are grossly intact. Proprioception is intact. No peripheral adenopathy or edema is  identified. No motor or sensory levels are noted. Crude visual fields are within normal range. ? ?RADIOLOGY RESULTS: Mammograms reviewed compatible with above-stated findings ? ?PLAN: Present time patient is doing well 1 year out with no evidence of disease.  I am pleased with her overall progress.  I have asked to see her back in 1 year for follow-up.  She continues on tamoxifen without side effect.  Patient knows to call with any concerns. ? ?I would like to take this opportunity to thank you for allowing me to participate in the care of your patient.. ?  ? Erin Filbert, MD ? ?

## 2021-12-07 ENCOUNTER — Encounter: Payer: Self-pay | Admitting: Oncology

## 2021-12-07 ENCOUNTER — Inpatient Hospital Stay: Payer: Medicare Other | Attending: Oncology | Admitting: Oncology

## 2021-12-07 VITALS — BP 128/71 | HR 93 | Temp 98.4°F | Resp 16 | Wt 160.0 lb

## 2021-12-07 DIAGNOSIS — Z923 Personal history of irradiation: Secondary | ICD-10-CM | POA: Insufficient documentation

## 2021-12-07 DIAGNOSIS — Z79810 Long term (current) use of selective estrogen receptor modulators (SERMs): Secondary | ICD-10-CM | POA: Diagnosis not present

## 2021-12-07 DIAGNOSIS — Z853 Personal history of malignant neoplasm of breast: Secondary | ICD-10-CM | POA: Diagnosis not present

## 2021-12-07 DIAGNOSIS — Z79899 Other long term (current) drug therapy: Secondary | ICD-10-CM | POA: Insufficient documentation

## 2021-12-07 DIAGNOSIS — M810 Age-related osteoporosis without current pathological fracture: Secondary | ICD-10-CM | POA: Diagnosis not present

## 2021-12-07 DIAGNOSIS — Z87891 Personal history of nicotine dependence: Secondary | ICD-10-CM | POA: Diagnosis not present

## 2021-12-07 DIAGNOSIS — Z08 Encounter for follow-up examination after completed treatment for malignant neoplasm: Secondary | ICD-10-CM

## 2021-12-07 DIAGNOSIS — Z5181 Encounter for therapeutic drug level monitoring: Secondary | ICD-10-CM | POA: Diagnosis not present

## 2021-12-07 DIAGNOSIS — C50912 Malignant neoplasm of unspecified site of left female breast: Secondary | ICD-10-CM | POA: Insufficient documentation

## 2021-12-07 NOTE — Progress Notes (Signed)
Hematology/Oncology Consult note [facility]  Telephone:([phone] [phone]  Patient Care Team: Maryland Pink, MD as PCP - General (Family Medicine) Bary Castilla, Forest Gleason, MD (General Surgery) Maryland Pink, MD as Referring Physician (Family Medicine) [place] Pink, MD as Referring Physician (Family Medicine) Noreene Filbert, MD as Referring Physician (Radiation Oncology) Sindy Guadeloupe, MD as Consulting Physician (Oncology)   Name of the patient: Erin Nolan  [id number]  07/26/1945   Date of visit: 12/07/21  Diagnosis-history of stage I right breast cancer currently on tamoxifen  Chief complaint/ Reason for visit-routine follow-up of breast cancer  Heme/Onc history: Patient is a 76 year old female with a prior history of left breast cancer s/p left mastectomy with reconstruction in 1997.  She was noted to have an abnormal mammogram in February 2022which showed a hypoechoic mass in the right breast measuring 1.3 x 1 x 0.9 cm at the 10 o'clock position 5 cm from the nipple.  No sonographic correlate for the secondary of distortion.  Simple cyst of 0.5 cm at 9 o'clock position.  Normal-appearing right axillary lymph nodes.  Patient underwent biopsy of the primary breast mass which showed invasive mammary carcinoma 7 mm grade 2.  The other mass without sonographic correlate was also biopsied and was consistent with grade 1 invasive mammary carcinoma.  ER/PR positive and HER-2 negative    Patient underwent Lumpectomy by Dr. Christian Mate.  Final pathology showed multifocal invasive mammary carcinoma 11 mm with negative margins.  1 out of 2 sentinel lymph nodes was positive for malignancy.  PT1CPN1A Patient has baseline osteoporosis for which she is on Fosamax and was switched from Arimidex to tamoxifen which was started sometime in April 2022    Interval history-tolerating tamoxifen well without any significant side effects.  Denies any vaginal  bleeding.  She has chronic bilateral lower extremity pain during ambulation.  She is otherwise doing well for her age  ECOG PS- 1 Pain scale- 0   Review of systems- Review of Systems  Constitutional:  Positive for malaise/fatigue. Negative for chills, fever and weight loss.  HENT:  Negative for congestion, ear discharge and nosebleeds.   Eyes:  Negative for blurred vision.  Respiratory:  Negative for cough, hemoptysis, sputum production, shortness of breath and wheezing.   Cardiovascular:  Negative for chest pain, palpitations, orthopnea and claudication.  Gastrointestinal:  Negative for abdominal pain, blood in stool, constipation, diarrhea, heartburn, melena, nausea and vomiting.  Genitourinary:  Negative for dysuria, flank pain, frequency, hematuria and urgency.  Musculoskeletal:  Negative for back pain, joint pain and myalgias.  Skin:  Negative for rash.  Neurological:  Negative for dizziness, tingling, focal weakness, seizures, weakness and headaches.  Endo/Heme/Allergies:  Does not bruise/bleed easily.  Psychiatric/Behavioral:  Negative for depression and suicidal ideas. The patient does not have insomnia.       Allergies  Allergen Reactions   Neosporin [Neomycin-Bacitracin Zn-Polymyx] Itching     Past Medical History:  Diagnosis Date   Allergy    Anxiety    Breast cancer ([place]) 1987   Breast cancer ([place]) 2022   Cancer ([place]) 12/13/1985   LCIS, simple mastectomy, 3 cm area of LCIS, immediate reconstruction.   GERD (gastroesophageal reflux disease)    Hyperlipidemia    Hypertension    Hypothyroidism    Personal history of colonic polyps    2013: Sessile adenoma of ascending colon, Tubular adenoma at 25 cm; 2008: Adenomatous polyp at 30 cm and rectum.1994: Adenomatous polyp at 30 cm;  1992: Adenomatous polyp at 25 cm   Personal history of tobacco use, presenting hazards to health    Proctitis 2008   Shingles 2012   Special screening for malignant neoplasms, colon     Thyroid disease 2005   hypothyroidism     Past Surgical History:  Procedure Laterality Date   BREAST BIOPSY Right 07/03/2020   [place] BX. q [facility]   BREAST BIOPSY Right 07/03/2020   Stereo Bx, x-clip, [place]   BREAST EXCISIONAL BIOPSY Right 1988   Fibroadenoma   BREAST LUMPECTOMY,RADIO FREQ LOCALIZER,AXILLARY SENTINEL LYMPH NODE BIOPSY Right 07/18/2020   Procedure: BREAST LUMPECTOMY,RADIO FREQ LOCALIZER,AXILLARY SENTINEL LYMPH NODE BIOPSY;  Surgeon: Ronny Bacon, MD;  Location: [facility] ORS;  Service: General;  Laterality: Right;   BREAST RECONSTRUCTION Left    BREAST SURGERY Left 1987   mastectomy   CATARACT EXTRACTION     COLONOSCOPY  2012   hx of colon polyps, Dr. Bary Castilla   COLONOSCOPY WITH PROPOFOL N/A 11/24/2016   Procedure: COLONOSCOPY WITH PROPOFOL;  Surgeon: Robert Bellow, MD;  Location: [facility] ENDOSCOPY;  Service: Endoscopy;  Laterality: N/A;   MASTECTOMY Left 1987   breast ca   TUBAL LIGATION      Social History   Socioeconomic History   Marital status: Married    Spouse name: Not on file   Number of children: Not on file   Years of education: Not on file   Highest education level: Not on file  Occupational History   Not on file  Tobacco Use   Smoking status: Former    Packs/day: 1.00    Years: 42.00    Total pack years: 42.00    Types: Cigarettes   Smokeless tobacco: Never  Vaping Use   Vaping Use: Never used  Substance and Sexual Activity   Alcohol use: No   Drug use: No   Sexual activity: Not Currently  Other Topics Concern   Not on file  Social History Narrative   Not on file   Social Determinants of Health   Financial Resource Strain: Not on file  Food Insecurity: Not on file  Transportation Needs: Not on file  Physical Activity: Not on file  Stress: Not on file  Social Connections: Not on file  Intimate Partner Violence: Not on file    Family History  Problem Relation Age of Onset   Cancer Sister    Breast cancer Sister    Cancer  Sister 30       uterine   Cancer Sister        colon cancer     Current Outpatient Medications:    alendronate (FOSAMAX) 70 MG tablet, Take 1 tablet (70 mg total) by mouth once a week. Take with a full glass of water on an empty stomach., Disp: 12 tablet, Rfl: 3   amLODipine (NORVASC) 10 MG tablet, Take 1 tablet by mouth daily., Disp: , Rfl:    calcium carbonate (OS-CAL) 600 MG TABS tablet, 600 mg daily with breakfast., Disp: , Rfl:    cholecalciferol (VITAMIN D3) 25 MCG (1000 UNIT) tablet, Take 1,000 Units by mouth every evening., Disp: , Rfl:    citalopram (CELEXA) 10 MG tablet, Take 10 mg by mouth every evening., Disp: , Rfl:    Glucosamine-Chondroitin (COSAMIN DS PO), Take 1 tablet by mouth in the morning and at bedtime., Disp: , Rfl:    KLOR-CON M20 20 MEQ tablet, Take 20 mEq by mouth 2 (two) times daily., Disp: , Rfl:    levothyroxine (  SYNTHROID, LEVOTHROID) 50 MCG tablet, Take 25-50 mcg by mouth See admin instructions. Take 0.5 tablet (25 mcg) by mouth every other day alternating with taking 1 tablet (50 mcg) by mouth every other day in the morning before breakfast, Disp: , Rfl:    Multiple Vitamin (MULTIVITAMIN WITH MINERALS) TABS tablet, Take 1 tablet by mouth every evening., Disp: , Rfl:    pantoprazole (PROTONIX) 40 MG tablet, Take 1 tablet by mouth daily., Disp: , Rfl:    simvastatin (ZOCOR) 20 MG tablet, Take 1 tablet by mouth daily., Disp: , Rfl:    tamoxifen (NOLVADEX) 20 MG tablet, Take 1 tablet (20 mg total) by mouth daily., Disp: 90 tablet, Rfl: 3   triamterene-hydrochlorothiazide (MAXZIDE) 75-50 MG per tablet, Take 0.5 tablets by mouth daily., Disp: , Rfl:    acetaminophen (TYLENOL) 500 MG tablet, Take 500 mg by mouth every 6 (six) hours as needed (for pain)., Disp: , Rfl:   Physical exam:  Vitals:   12/07/21 1115  BP: 128/71  Pulse: 93  Resp: 16  Temp: 98.4 F (36.9 C)  TempSrc: Oral  SpO2: 97%  Weight: 160 lb (72.6 kg)   Physical Exam Constitutional:       Comments: Ambulates with a walker.  Appears in no acute distress  Cardiovascular:     Rate and Rhythm: Normal rate and regular rhythm.     Heart sounds: Normal heart sounds.  Pulmonary:     Effort: Pulmonary effort is normal.     Breath sounds: Normal breath sounds.  Abdominal:     General: Bowel sounds are normal.     Palpations: Abdomen is soft.  Skin:    General: Skin is warm and dry.  Neurological:     Mental Status: She is alert and oriented to person, place, and time.   Breast exam: Patient is s/p left nipple sparing mastectomy with reconstruction.  No evidence of chest wall recurrence.  No palpable left axillary adenopathy.  Patient is s/p right lumpectomy with well-healed surgical scar.  No evidence of any palpable right breast masses or right axillary adenopathy.     Latest Ref Rng & Units 06/09/2021   10:41 AM  CMP  Glucose 70 - 99 mg/dL 100   BUN 8 - 23 mg/dL 14   Creatinine 0.44 - 1.00 mg/dL 0.81   Sodium 135 - 145 mmol/L 136   Potassium 3.5 - 5.1 mmol/L 4.1   Chloride 98 - 111 mmol/L 99   CO2 22 - 32 mmol/L 27   Calcium 8.9 - 10.3 mg/dL 9.6   Total Protein 6.5 - 8.1 g/dL 7.7   Total Bilirubin 0.3 - 1.2 mg/dL 0.6   Alkaline Phos 38 - 126 U/L 44   AST 15 - 41 U/L 20   ALT 0 - 44 U/L 16       Latest Ref Rng & Units 06/09/2021   10:41 AM  CBC  WBC 4.0 - 10.5 K/uL 6.2   Hemoglobin 12.0 - 15.0 g/dL 13.8   Hematocrit 36.0 - 46.0 % 41.8   Platelets 150 - 400 K/uL 233     No images are attached to the encounter.  No results found.   Assessment and plan- Patient is a 76 y.o. female with history of left breast cancer status postmastectomy and reconstruction in 1987 followed by stage I ER/PR positive HER2 negative right breast cancerIn February 2022 status postlumpectomy and adjuvant radiation therapy currently on tamoxifen here for routine follow-up  [place] patient is doing well  with no concerning signs and symptoms of recurrence based on today's exam.  She will be  due for a diagnostic right breast mammogram in December 2023 which I will schedule at this time.  I will see her back in 6 months no labs.  For osteoporosis patient is on calcium vitamin D and Fosamax which she will continue.   Visit Diagnosis 1. Encounter for follow-up surveillance of breast cancer   2. Encounter for monitoring tamoxifen therapy      Dr. Randa Evens, MD, MPH [facility] at [facility] [phone] 12/07/2021 1:03 PM

## 2021-12-07 NOTE — Progress Notes (Signed)
Returns for 6 month follow-up. No new concerns at this time.

## 2021-12-13 ENCOUNTER — Other Ambulatory Visit: Payer: Self-pay | Admitting: Nurse Practitioner

## 2022-01-13 ENCOUNTER — Other Ambulatory Visit: Payer: Self-pay | Admitting: Nurse Practitioner

## 2022-02-08 IMAGING — MG DIGITAL SCREENING UNILAT RIGHT W/ TOMO W/ CAD
6 series · 6 of 18 positions shown · non-contrast
Comparison: Previous exam(s).

CLINICAL DATA: Screening.

EXAM:
DIGITAL SCREENING UNILATERAL RIGHT MAMMOGRAM WITH CAD AND TOMO

[R CC synth-2D]
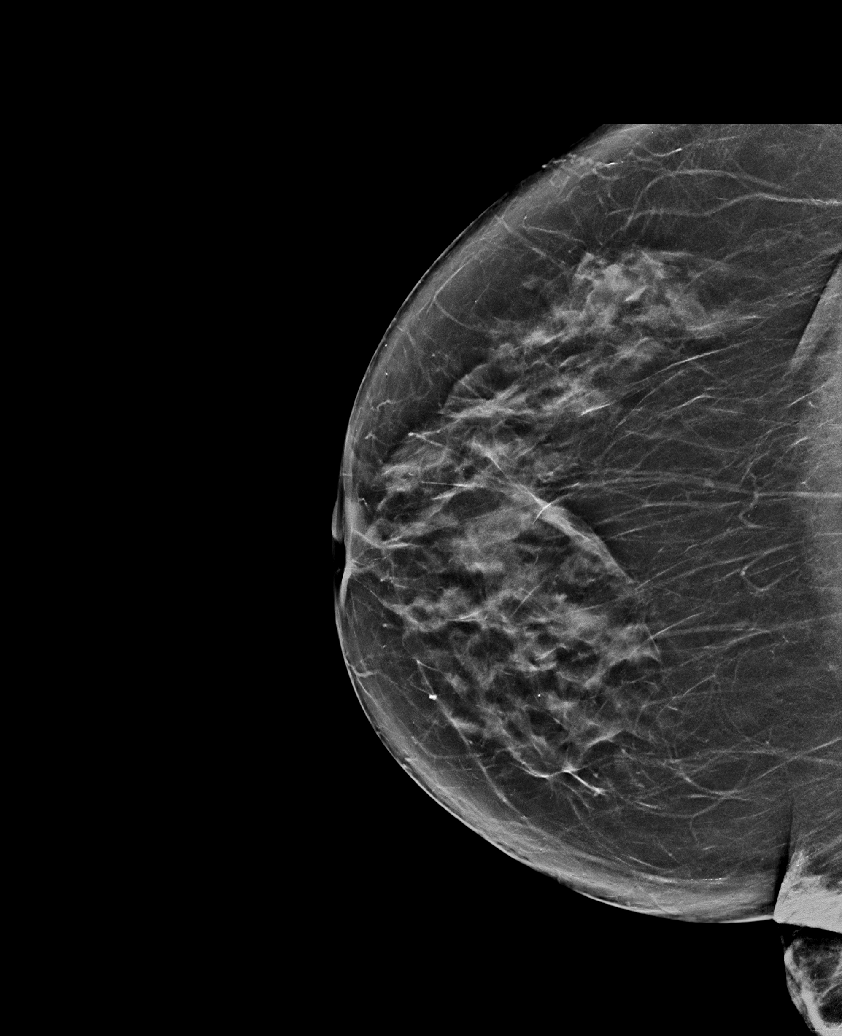

[R MLO synth-2D]
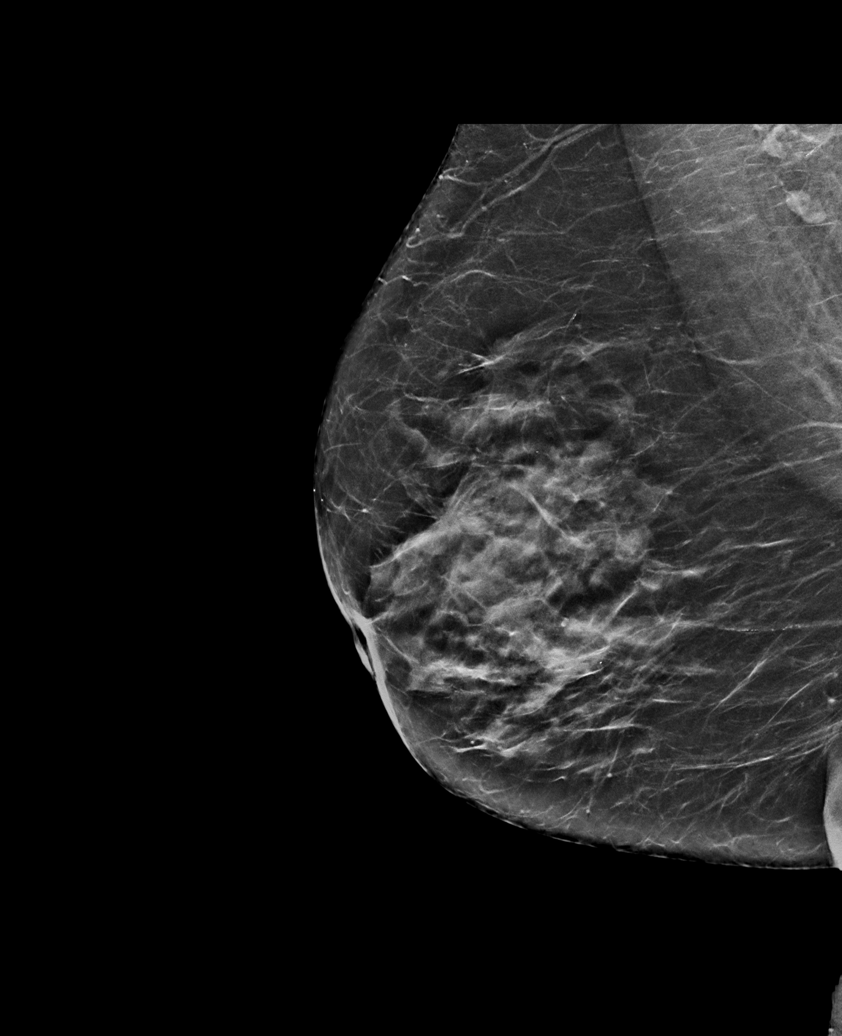

[R XCCL synth-2D]
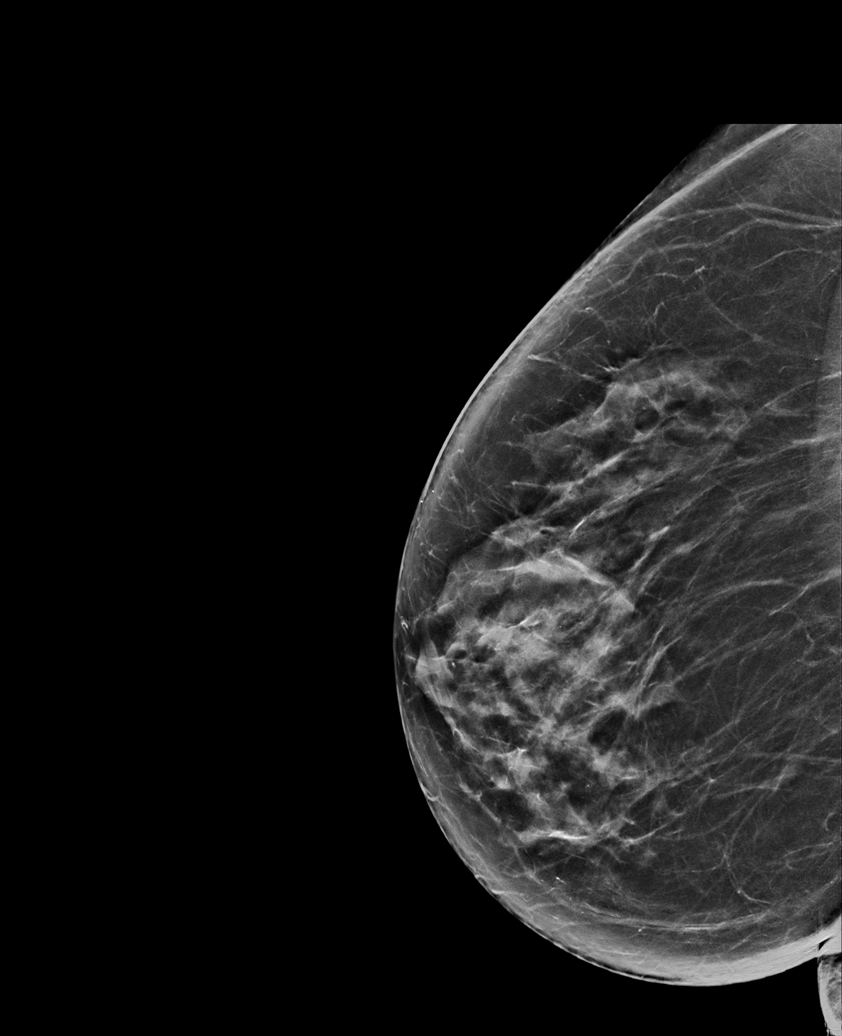

[R MLO tomo · tomo slice 35/68.0]
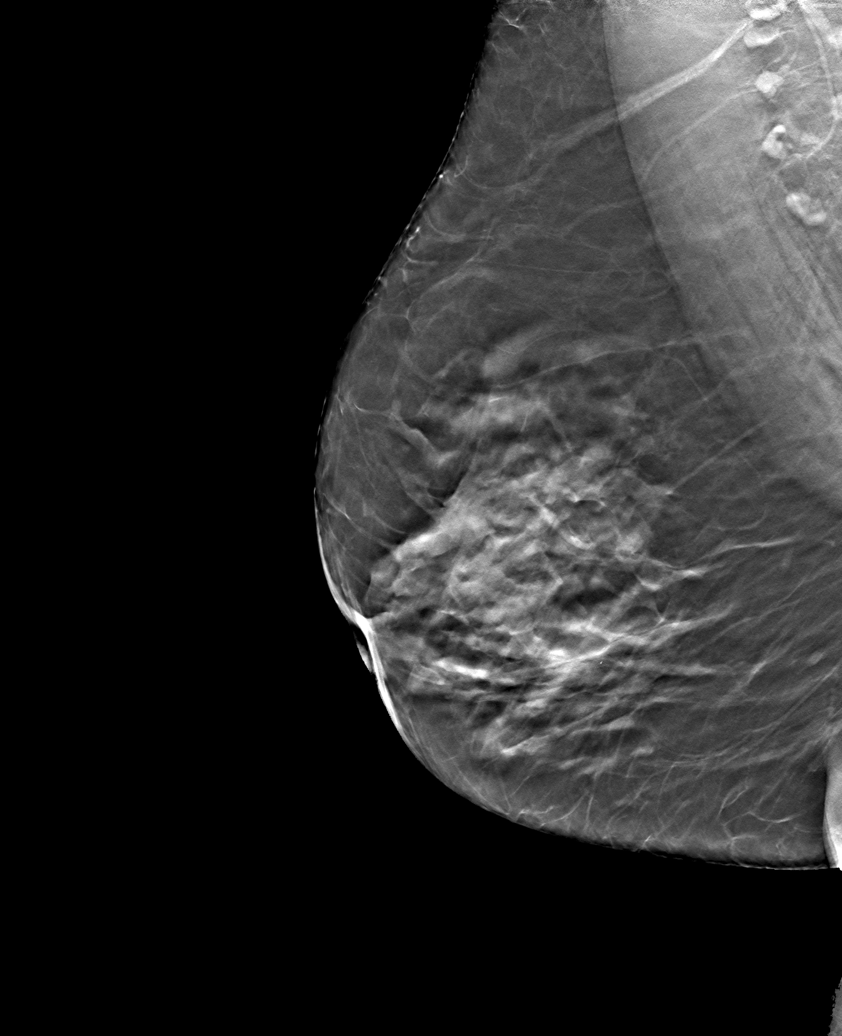

[R CC tomo · tomo slice 33/66.0]
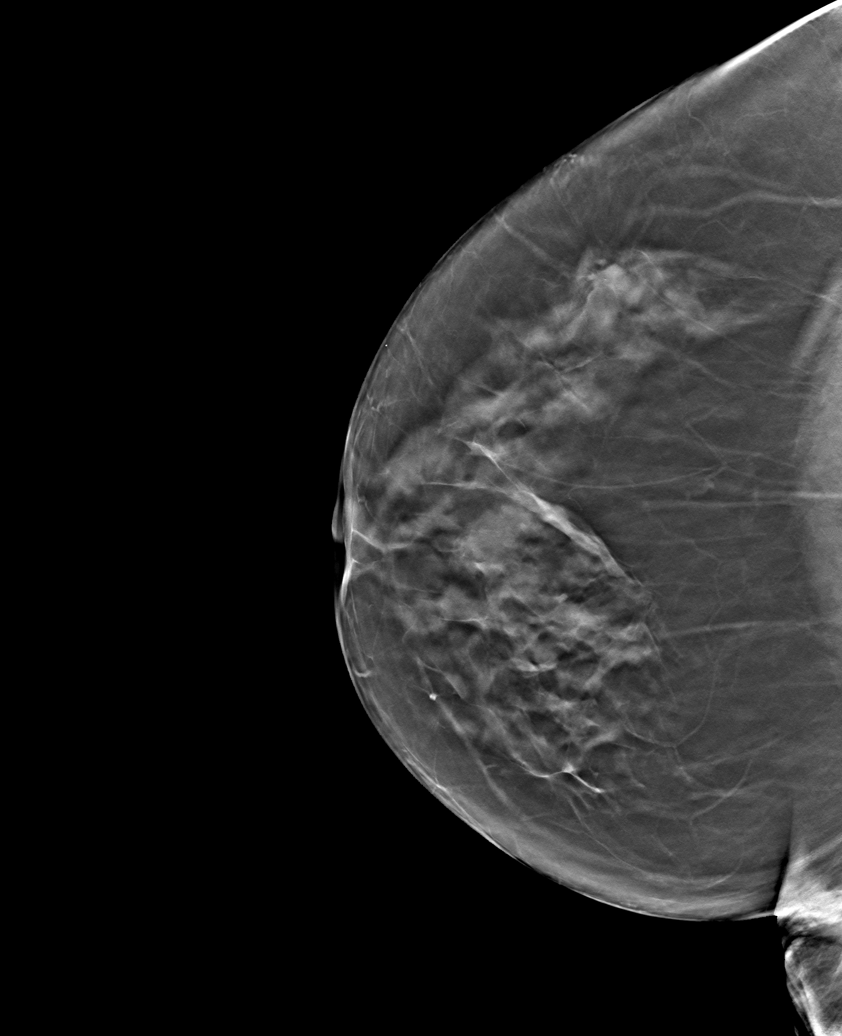

[R XCCL tomo · tomo slice 37/72.0]
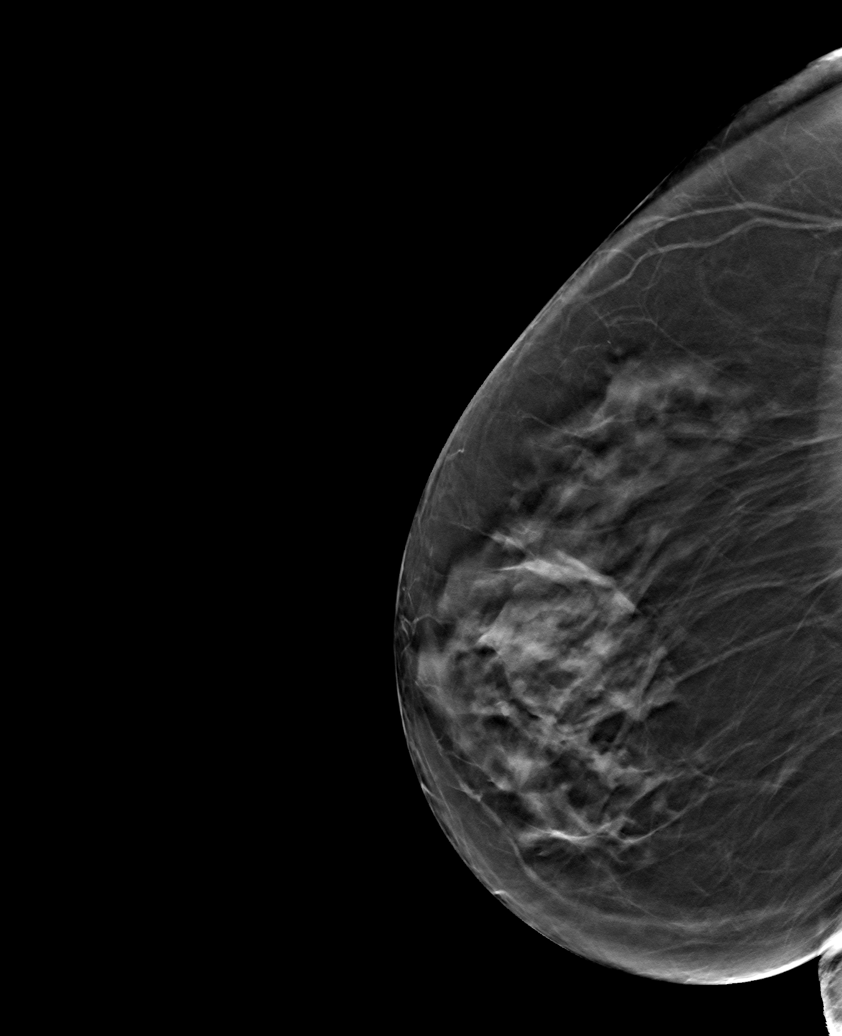

[6 of 18 positions shown; findings below may reference images not displayed]

ACR Breast Density Category c: The breast tissue is heterogeneously
dense, which may obscure small masses.
FINDINGS: In the right breast, 2 possible areas of distortion warrants further
evaluation. In the left breast, no findings suspicious for
malignancy. Images were processed with CAD.
IMPRESSION: Further evaluation is suggested for 2 possible areas of distortion
in the right breast.

RECOMMENDATION:
Diagnostic mammogram and possibly ultrasound of the right breast.
(Code:S9-L-SSM)

The patient will be contacted regarding the findings, and additional
imaging will be scheduled.

BI-RADS CATEGORY  0: Incomplete. Need additional imaging evaluation
and/or prior mammograms for comparison.

## 2022-02-10 MED ORDER — ALENDRONATE SODIUM 70 MG PO TABS: 70.0000 mg | ORAL_TABLET | ORAL | 3 refills | Status: DC

## 2022-06-08 ENCOUNTER — Ambulatory Visit
Admission: RE | Admit: 2022-06-08 | Discharge: 2022-06-08 | Disposition: A | Discharge status: Home / Self Care | Payer: Medicare Other | Source: Ambulatory Visit | Attending: Oncology | Admitting: Oncology

## 2022-06-08 DIAGNOSIS — Z08 Encounter for follow-up examination after completed treatment for malignant neoplasm: Secondary | ICD-10-CM | POA: Insufficient documentation

## 2022-06-08 DIAGNOSIS — Z853 Personal history of malignant neoplasm of breast: Secondary | ICD-10-CM | POA: Diagnosis present

## 2022-06-11 ENCOUNTER — Encounter: Payer: Self-pay | Admitting: Oncology

## 2022-06-11 ENCOUNTER — Inpatient Hospital Stay: Payer: Medicare Other | Attending: Oncology | Admitting: Oncology

## 2022-06-11 VITALS — BP 126/82 | HR 80 | Temp 99.7°F | Resp 16 | Wt 162.0 lb

## 2022-06-11 DIAGNOSIS — Z79810 Long term (current) use of selective estrogen receptor modulators (SERMs): Secondary | ICD-10-CM | POA: Diagnosis not present

## 2022-06-11 DIAGNOSIS — Z8049 Family history of malignant neoplasm of other genital organs: Secondary | ICD-10-CM | POA: Insufficient documentation

## 2022-06-11 DIAGNOSIS — I10 Essential (primary) hypertension: Secondary | ICD-10-CM | POA: Insufficient documentation

## 2022-06-11 DIAGNOSIS — C50411 Malignant neoplasm of upper-outer quadrant of right female breast: Secondary | ICD-10-CM | POA: Insufficient documentation

## 2022-06-11 DIAGNOSIS — C50911 Malignant neoplasm of unspecified site of right female breast: Secondary | ICD-10-CM

## 2022-06-11 DIAGNOSIS — M810 Age-related osteoporosis without current pathological fracture: Secondary | ICD-10-CM | POA: Insufficient documentation

## 2022-06-11 DIAGNOSIS — Z803 Family history of malignant neoplasm of breast: Secondary | ICD-10-CM | POA: Insufficient documentation

## 2022-06-11 DIAGNOSIS — Z170 Estrogen receptor positive status [ER+]: Secondary | ICD-10-CM | POA: Diagnosis not present

## 2022-06-11 DIAGNOSIS — Z9012 Acquired absence of left breast and nipple: Secondary | ICD-10-CM | POA: Diagnosis not present

## 2022-06-11 DIAGNOSIS — Z5181 Encounter for therapeutic drug level monitoring: Secondary | ICD-10-CM | POA: Diagnosis not present

## 2022-06-11 DIAGNOSIS — Z87891 Personal history of nicotine dependence: Secondary | ICD-10-CM | POA: Diagnosis not present

## 2022-06-11 DIAGNOSIS — Z800 Family history of malignant neoplasm of digestive organs: Secondary | ICD-10-CM | POA: Insufficient documentation

## 2022-06-11 NOTE — Progress Notes (Unsigned)
Pt in for 6 month follow up, denies any concerns.

## 2022-06-13 NOTE — Progress Notes (Signed)
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  She was noted to have an abnormal mammogram in February 2022which showed a hypoechoic mass in the right breast measuring 1.3 x 1 x 0.9 cm at the 10 o'clock position 5 cm from the nipple.  No sonographic correlate for the secondary of distortion.  Simple cyst of 0.5 cm at 9 o'clock position.  Normal-appearing right axillary lymph nodes.  Patient underwent biopsy of the primary breast mass which showed invasive mammary carcinoma 7 mm grade 2.  The other mass without sonographic correlate was also biopsied and was consistent with grade 1 invasive mammary carcinoma.  ER/PR positive and HER-2 negative    Patient underwent Lumpectomy by Dr. Christian Mate.  Final pathology showed multifocal invasive mammary carcinoma 11 mm with negative margins.  1 out of 2 sentinel lymph nodes was positive for malignancy.  PT1CPN1A Patient has baseline osteoporosis for which she is on Fosamax and was switched from Arimidex to tamoxifen which was started sometime in April 2022    Interval history-patient is tolerating tamoxifen well without any significant side effects.  No recent  hospitalizations.  ECOG PS- 1 Pain scale- 0   Review of systems- Review of Systems  Constitutional:  Negative for chills, fever, malaise/fatigue and weight loss.  HENT:  Negative for congestion, ear discharge and nosebleeds.   Eyes:  Negative for blurred vision.  Respiratory:  Negative for cough, hemoptysis, sputum production, shortness of breath and wheezing.   Cardiovascular:  Negative for chest pain, palpitations, orthopnea and claudication.  Gastrointestinal:  Negative for abdominal pain, blood in stool, constipation, diarrhea, heartburn, melena, nausea and vomiting.  Genitourinary:  Negative for dysuria, flank pain, frequency, hematuria and urgency.  Musculoskeletal:  Negative for back pain, joint pain and myalgias.  Skin:  Negative for rash.  Neurological:  Negative for dizziness, tingling, focal weakness, seizures, weakness and headaches.  Endo/Heme/Allergies:  Does not bruise/bleed easily.  Psychiatric/Behavioral:  Negative for depression and suicidal ideas. The patient does not have insomnia.       Allergies  Allergen Reactions   Neosporin [Neomycin-Bacitracin Zn-Polymyx] Itching     Past Medical History:  Diagnosis Date   Allergy    Anxiety    Breast cancer ([place]) 1987   Breast cancer ([place]) 2022   Cancer ([place]) 12/13/1985   LCIS, simple mastectomy, 3 cm area of LCIS, immediate reconstruction.   GERD (gastroesophageal reflux disease)    Hyperlipidemia    Hypertension    Hypothyroidism    Personal history of colonic polyps    2013: Sessile adenoma of ascending colon, Tubular adenoma at 25 cm; 2008: Adenomatous polyp at 30 cm and rectum.1994: Adenomatous polyp at 30 cm;  1992: Adenomatous polyp at 25 cm   Personal history of tobacco use, presenting hazards to health  Proctitis 2008   Shingles 2012   Special screening for malignant neoplasms, colon    Thyroid disease 2005   hypothyroidism     Past Surgical History:  Procedure Laterality Date   BREAST BIOPSY  Right 07/03/2020   [place] BX. q clip,[facility]   BREAST BIOPSY Right 07/03/2020   Stereo Bx, x-clip, [place]   BREAST EXCISIONAL BIOPSY Right 1988   Fibroadenoma   BREAST LUMPECTOMY,RADIO FREQ LOCALIZER,AXILLARY SENTINEL LYMPH NODE BIOPSY Right 07/18/2020   Procedure: BREAST LUMPECTOMY,RADIO FREQ LOCALIZER,AXILLARY SENTINEL LYMPH NODE BIOPSY;  Surgeon: Ronny Bacon, MD;  Location: [facility] ORS;  Service: General;  Laterality: Right;   BREAST RECONSTRUCTION Left    BREAST SURGERY Left 1987   mastectomy   CATARACT EXTRACTION     COLONOSCOPY  2012   hx of colon polyps, Dr. Bary Castilla   COLONOSCOPY WITH PROPOFOL N/A 11/24/2016   Procedure: COLONOSCOPY WITH PROPOFOL;  Surgeon: Robert Bellow, MD;  Location: [facility] ENDOSCOPY;  Service: Endoscopy;  Laterality: N/A;   MASTECTOMY Left 1987   breast ca   TUBAL LIGATION      Social History   Socioeconomic History   Marital status: Married    Spouse name: Not on file   Number of children: Not on file   Years of education: Not on file   Highest education level: Not on file  Occupational History   Not on file  Tobacco Use   Smoking status: Former    Packs/day: 1.00    Years: 42.00    Total pack years: 42.00    Types: Cigarettes   Smokeless tobacco: Never  Vaping Use   Vaping Use: Never used  Substance and Sexual Activity   Alcohol use: No   Drug use: No   Sexual activity: Not Currently  Other Topics Concern   Not on file  Social History Narrative   Not on file   Social Determinants of Health   Financial Resource Strain: Not on file  Food Insecurity: Not on file  Transportation Needs: Not on file  Physical Activity: Not on file  Stress: Not on file  Social Connections: Not on file  Intimate Partner Violence: Not on file    Family History  Problem Relation Age of Onset   Cancer Sister    Breast cancer Sister    Cancer Sister 92       uterine   Cancer Sister        colon cancer     Current Outpatient Medications:     acetaminophen (TYLENOL) 500 MG tablet, Take 500 mg by mouth every 6 (six) hours as needed (for pain)., Disp: , Rfl:    alendronate (FOSAMAX) 70 MG tablet, Take 1 tablet (70 mg total) by mouth once a week. Take with a full glass of water on an empty stomach., Disp: 12 tablet, Rfl: 3   amLODipine (NORVASC) 10 MG tablet, Take 1 tablet by mouth daily., Disp: , Rfl:    calcium carbonate (OS-CAL) 600 MG TABS tablet, 600 mg daily with breakfast., Disp: , Rfl:    cholecalciferol (VITAMIN D3) 25 MCG (1000 UNIT) tablet, Take 1,000 Units by mouth every evening., Disp: , Rfl:    citalopram (CELEXA) 10 MG tablet, Take 10 mg by mouth every evening., Disp: , Rfl:    Glucosamine-Chondroitin (COSAMIN DS PO), Take 1 tablet by mouth in the morning and at bedtime., Disp: , Rfl:    KLOR-CON M20 20 MEQ tablet, Take 20 mEq by mouth 2 (two) times daily., Disp: , Rfl:  levothyroxine (SYNTHROID, LEVOTHROID) 50 MCG tablet, Take 25-50 mcg by mouth See admin instructions. Take 0.5 tablet (25 mcg) by mouth every other day alternating with taking 1 tablet (50 mcg) by mouth every other day in the morning before breakfast, Disp: , Rfl:    Multiple Vitamin (MULTIVITAMIN WITH MINERALS) TABS tablet, Take 1 tablet by mouth every evening., Disp: , Rfl:    pantoprazole (PROTONIX) 40 MG tablet, Take 1 tablet by mouth daily., Disp: , Rfl:    simvastatin (ZOCOR) 20 MG tablet, Take 1 tablet by mouth daily., Disp: , Rfl:    tamoxifen (NOLVADEX) 20 MG tablet, TAKE 1 TABLET BY MOUTH  DAILY, Disp: 90 tablet, Rfl: 3   triamterene-hydrochlorothiazide (MAXZIDE) 75-50 MG per tablet, Take 0.5 tablets by mouth daily., Disp: , Rfl:   Physical exam:  Vitals:   06/11/22 1333  BP: 126/82  Pulse: 80  Resp: 16  Temp: 99.7 F (37.6 C)  TempSrc: Tympanic  Weight: 162 lb (73.5 kg)   Physical Exam Cardiovascular:     Rate and Rhythm: Normal rate and regular rhythm.     Heart sounds: Normal heart sounds.  Pulmonary:     Effort: Pulmonary effort  is normal.     Breath sounds: Normal breath sounds.  Skin:    General: Skin is warm and dry.  Neurological:     Mental Status: She is alert and oriented to person, place, and time.   Breast exam: Patient is s/p left nipple sparing mastectomy with reconstruction.  She is s/p right lumpectomy with a well-healed surgical scar.  No palpable masses in the right breast.  No palpable right axillary adenopathy.     Latest Ref Rng & Units 06/09/2021   10:41 AM  CMP  Glucose 70 - 99 mg/dL 100   BUN 8 - 23 mg/dL 14   Creatinine 0.44 - 1.00 mg/dL 0.81   Sodium 135 - 145 mmol/L 136   Potassium 3.5 - 5.1 mmol/L 4.1   Chloride 98 - 111 mmol/L 99   CO2 22 - 32 mmol/L 27   Calcium 8.9 - 10.3 mg/dL 9.6   Total Protein 6.5 - 8.1 g/dL 7.7   Total Bilirubin 0.3 - 1.2 mg/dL 0.6   Alkaline Phos 38 - 126 U/L 44   AST 15 - 41 U/L 20   ALT 0 - 44 U/L 16       Latest Ref Rng & Units 06/09/2021   10:41 AM  CBC  WBC 4.0 - 10.5 K/uL 6.2   Hemoglobin 12.0 - 15.0 g/dL 13.8   Hematocrit 36.0 - 46.0 % 41.8   Platelets 150 - 400 K/uL 233     No images are attached to the encounter.  MM DIAG BREAST TOMO UNI RIGHT  Result Date: 06/08/2022 CLINICAL DATA:  77 year old female presenting for routine annual surveillance status post right breast lumpectomy in February of 2022. She has had a prior left breast mastectomy for cancer. EXAM: DIGITAL DIAGNOSTIC UNILATERAL RIGHT MAMMOGRAM WITH TOMOSYNTHESIS TECHNIQUE: Right digital diagnostic mammography and breast tomosynthesis was performed. COMPARISON:  Previous exam(s). ACR Breast Density Category c: The breast tissue is heterogeneously dense, which may obscure small masses. FINDINGS: The right breast lumpectomy site is stable. No suspicious calcifications, masses or areas of distortion are seen in the right breast. The patient is status post left mastectomy. IMPRESSION: Stable right breast lumpectomy site. No evidence of right breast malignancy. RECOMMENDATION: Diagnostic  mammogram is suggested in 1 year. (Code:DM-B-01Y) I have discussed the findings and  recommendations with the patient. If applicable, a reminder letter will be sent to the patient regarding the next appointment. BI-RADS CATEGORY  2: Benign. Electronically Signed   By: Ammie Ferrier M.D.   On: 06/08/2022 13:58     Assessment and plan- Patient is a 77 y.o. female  with history of left breast cancer status postmastectomy and reconstruction in 1987 followed by stage I ER/PR positive HER2 negative right breast cancerIn February 2022 status postlumpectomy and adjuvant radiation therapy currently on tamoxifen.  She is here for routine follow-up  Patient is doing well without any concerning signs and symptoms of recurrence based on today's exam.  Her recent right breast mammogram from January 2024 was unremarkable.I will see her back in 6 months no labs   Visit Diagnosis 1. Malignant neoplasm of right female breast, unspecified estrogen receptor status, unspecified site of breast ([place])   2. Encounter for monitoring tamoxifen therapy      Dr. Randa Evens, MD, MPH [facility] [phone] 06/13/2022 9:05 AM

## 2022-10-21 ENCOUNTER — Ambulatory Visit
Admission: RE | Admit: 2022-10-21 | Discharge: 2022-10-21 | Disposition: A | Discharge status: Home / Self Care | Source: Ambulatory Visit | Attending: Radiation Oncology | Admitting: Radiation Oncology

## 2022-10-21 ENCOUNTER — Encounter: Payer: Self-pay | Admitting: Radiation Oncology

## 2022-10-21 VITALS — BP 129/81 | HR 85 | Temp 96.6°F | Resp 20 | Wt 163.7 lb

## 2022-10-21 DIAGNOSIS — C50911 Malignant neoplasm of unspecified site of right female breast: Secondary | ICD-10-CM

## 2022-10-21 NOTE — Progress Notes (Signed)
Radiation Oncology Follow up Note  Name: Erin Nolan   Date:   10/21/2022 MRN:  [id number] DOB: [birth year]    This 77 y.o. female presents to the clinic today for 2-year follow-up status post whole breast radiation to her right breast for stage T1 cN1a M0 ER/PR positive invasive mammary carcinoma status post wide local excision.  REFERRING PROVIDER: Jerl Mina, MD  HPI: Patient is a 77 year old female now out over 2 years having completed right breast and peripheral lymphatic radiation therapy for stage T1c N1a ER/PR positive invasive mammary carcinoma.  Seen today in routine follow-up she is doing well she in the past has had reconstruction of her left breast.  She specifically denies any chest wall pain cough or bone pain.  She has no swelling of her right upper extremity..  Mammogram back in January which I have reviewed was BI-RADS 2 benign of the right breast.  She is currently on tamoxifen tolerating it well without side effects.  COMPLICATIONS OF TREATMENT: none  FOLLOW UP COMPLIANCE: keeps appointments   PHYSICAL EXAM:  BP 129/81   Pulse 85   Temp (!) 96.6 F (35.9 C)   Resp 20   Wt 163 lb 11.2 oz (74.3 kg)   SpO2 97%   BMI 30.93 kg/m  Patient has a reconstructed left breast right breast is free of dominant mass or nodularity no axillary or supraclavicular adenopathy is identified.  Well-developed well-nourished patient in NAD. HEENT reveals PERLA, EOMI, discs not visualized.  Oral cavity is clear. No oral mucosal lesions are identified. Neck is clear without evidence of cervical or supraclavicular adenopathy. Lungs are clear to A&P. Cardiac examination is essentially unremarkable with regular rate and rhythm without murmur rub or thrill. Abdomen is benign with no organomegaly or masses noted. Motor sensory and DTR levels are equal and symmetric in the upper and lower extremities. Cranial nerves II through XII are grossly intact. Proprioception is intact. No peripheral  adenopathy or edema is identified. No motor or sensory levels are noted. Crude visual fields are within normal range.  RADIOLOGY RESULTS: Mammogram reviewed compatible with above-stated findings  PLAN: Present time patient is doing well with no evidence of disease now out over 2 years.  Based on her mobility issues I am to turn follow-up care over to medical oncology.  Would be happy to reevaluate the patient anytime should that be indicated.  Patient is to call with any concerns at any time.  I would like to take this opportunity to thank you for allowing me to participate in the care of your patient.Carmina Miller, MD

## 2022-11-02 ENCOUNTER — Ambulatory Visit
Admission: RE | Admit: 2022-11-02 | Discharge: 2022-11-02 | Disposition: A | Discharge status: Home / Self Care | Payer: Medicare Other | Source: Ambulatory Visit | Attending: Oncology | Admitting: Oncology

## 2022-11-02 DIAGNOSIS — Z13820 Encounter for screening for osteoporosis: Secondary | ICD-10-CM | POA: Diagnosis not present

## 2022-11-02 DIAGNOSIS — Z923 Personal history of irradiation: Secondary | ICD-10-CM | POA: Insufficient documentation

## 2022-11-02 DIAGNOSIS — M810 Age-related osteoporosis without current pathological fracture: Secondary | ICD-10-CM | POA: Diagnosis not present

## 2022-11-02 DIAGNOSIS — E0590 Thyrotoxicosis, unspecified without thyrotoxic crisis or storm: Secondary | ICD-10-CM | POA: Diagnosis not present

## 2022-11-02 DIAGNOSIS — Z853 Personal history of malignant neoplasm of breast: Secondary | ICD-10-CM | POA: Diagnosis present

## 2022-11-02 DIAGNOSIS — C50911 Malignant neoplasm of unspecified site of right female breast: Secondary | ICD-10-CM | POA: Insufficient documentation

## 2022-11-15 ENCOUNTER — Other Ambulatory Visit: Payer: Self-pay | Admitting: Oncology

## 2022-11-17 ENCOUNTER — Other Ambulatory Visit: Payer: Self-pay | Admitting: Oncology

## 2022-12-14 ENCOUNTER — Inpatient Hospital Stay: Attending: Oncology | Admitting: Oncology

## 2022-12-14 ENCOUNTER — Encounter: Payer: Self-pay | Admitting: Oncology

## 2022-12-14 VITALS — BP 138/81 | HR 91 | Temp 99.1°F | Ht 61.0 in | Wt 161.8 lb

## 2022-12-14 DIAGNOSIS — C50411 Malignant neoplasm of upper-outer quadrant of right female breast: Secondary | ICD-10-CM | POA: Diagnosis present

## 2022-12-14 DIAGNOSIS — Z08 Encounter for follow-up examination after completed treatment for malignant neoplasm: Secondary | ICD-10-CM

## 2022-12-14 DIAGNOSIS — M810 Age-related osteoporosis without current pathological fracture: Secondary | ICD-10-CM | POA: Diagnosis not present

## 2022-12-14 DIAGNOSIS — Z803 Family history of malignant neoplasm of breast: Secondary | ICD-10-CM | POA: Insufficient documentation

## 2022-12-14 DIAGNOSIS — Z79810 Long term (current) use of selective estrogen receptor modulators (SERMs): Secondary | ICD-10-CM | POA: Diagnosis not present

## 2022-12-14 DIAGNOSIS — Z5181 Encounter for therapeutic drug level monitoring: Secondary | ICD-10-CM

## 2022-12-14 DIAGNOSIS — Z8049 Family history of malignant neoplasm of other genital organs: Secondary | ICD-10-CM | POA: Insufficient documentation

## 2022-12-14 DIAGNOSIS — Z87891 Personal history of nicotine dependence: Secondary | ICD-10-CM | POA: Diagnosis not present

## 2022-12-14 DIAGNOSIS — Z170 Estrogen receptor positive status [ER+]: Secondary | ICD-10-CM | POA: Insufficient documentation

## 2022-12-14 DIAGNOSIS — Z800 Family history of malignant neoplasm of digestive organs: Secondary | ICD-10-CM | POA: Diagnosis not present

## 2022-12-14 DIAGNOSIS — M85832 Other specified disorders of bone density and structure, left forearm: Secondary | ICD-10-CM | POA: Diagnosis not present

## 2022-12-14 NOTE — Progress Notes (Signed)
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  She was noted to have an abnormal mammogram in February 2022which showed a hypoechoic mass in the right breast measuring 1.3 x 1 x 0.9 cm at the 10 o'clock position 5 cm from the nipple.  No sonographic correlate for the secondary of distortion.  Simple cyst of 0.5 cm at 9 o'clock position.  Normal-appearing right axillary lymph nodes.  Patient underwent biopsy of the primary breast mass which showed invasive mammary carcinoma 7 mm grade 2.  The other mass without sonographic correlate was also biopsied and was consistent with grade 1 invasive mammary carcinoma.  ER/PR positive and HER-2 negative    Patient underwent Lumpectomy by Dr. Claudine Mouton.  Final pathology showed multifocal invasive mammary carcinoma 11 mm with negative margins.  1 out of 2 sentinel lymph nodes was positive for malignancy.  PT1CPN1A Patient has baseline osteoporosis for which she is on Fosamax and was switched from Arimidex to tamoxifen which was started sometime in April 2022    Interval history-patient is tolerating tamoxifen well without any significant side effects.  Denies any  breast concerns.  Appetite and weight have remained stable.  ECOG PS- 2 Pain scale- 0   Review of systems- Review of Systems  Constitutional:  Negative for chills, fever, malaise/fatigue and weight loss.  HENT:  Negative for congestion, ear discharge and nosebleeds.   Eyes:  Negative for blurred vision.  Respiratory:  Negative for cough, hemoptysis, sputum production, shortness of breath and wheezing.   Cardiovascular:  Negative for chest pain, palpitations, orthopnea and claudication.  Gastrointestinal:  Negative for abdominal pain, blood in stool, constipation, diarrhea, heartburn, melena, nausea and vomiting.  Genitourinary:  Negative for dysuria, flank pain, frequency, hematuria and urgency.  Musculoskeletal:  Negative for back pain, joint pain and myalgias.  Skin:  Negative for rash.  Neurological:  Negative for dizziness, tingling, focal weakness, seizures, weakness and headaches.  Endo/Heme/Allergies:  Does not bruise/bleed easily.  Psychiatric/Behavioral:  Negative for depression and suicidal ideas. The patient does not have insomnia.       Allergies  Allergen Reactions   Neosporin [Neomycin-Bacitracin Zn-Polymyx] Itching     Past Medical History:  Diagnosis Date   Allergy    Anxiety    Breast cancer (HCC) 1987   Breast cancer (HCC) 2022   Cancer (HCC) 12/13/1985   LCIS, simple mastectomy, 3 cm area of LCIS, immediate reconstruction.   GERD (gastroesophageal reflux disease)    Hyperlipidemia    Hypertension    Hypothyroidism    Personal history of colonic polyps    2013: Sessile adenoma of ascending colon, Tubular adenoma at 25 cm; 2008: Adenomatous polyp at 30 cm and rectum.1994: Adenomatous polyp at 30 cm;  1992: Adenomatous polyp at 25 cm  Personal history of tobacco use, presenting hazards to health    Proctitis 2008   Shingles 2012   Special screening for malignant neoplasms, colon    Thyroid disease 2005   hypothyroidism     Past Surgical History:   Procedure Laterality Date   BREAST BIOPSY Right 07/03/2020   [place] BX. q clip,[facility]   BREAST BIOPSY Right 07/03/2020   Stereo Bx, x-clip, [facility]   BREAST EXCISIONAL BIOPSY Right 1988   Fibroadenoma   BREAST LUMPECTOMY,RADIO FREQ LOCALIZER,AXILLARY SENTINEL LYMPH NODE BIOPSY Right 07/18/2020   Procedure: BREAST LUMPECTOMY,RADIO FREQ LOCALIZER,AXILLARY SENTINEL LYMPH NODE BIOPSY;  Surgeon: Campbell Lerner, MD;  Location: [facility] OR[facility];  Service: General;  Laterality: Right;   BREAST RECONSTRUCTION Left    BREAST SURGERY Left 1987   mastectomy   CATARACT EXTRACTION     COLONOSCOPY  2012   hx of colon polyps, Dr. Lemar Livings   COLONOSCOPY WITH PROPOFOL N/A 11/24/2016   Procedure: COLONOSCOPY WITH PROPOFOL;  Surgeon: [name], MD;  Location: [facility];  Service: Endoscopy;  Laterality: N/A;   MASTECTOMY Left 1987   breast ca   TUBAL LIGATION      Social History   Socioeconomic History   Marital status: Married    Spouse name: Not on file   Number of children: Not on file   Years of education: Not on file   Highest education level: Not on file  Occupational History   Not on file  Tobacco Use   Smoking status: Former    Packs/day: 1.00    Years: 42.00    Additional pack years: 0.00    Total pack years: 42.00    Types: Cigarettes   Smokeless tobacco: Never  Vaping Use   Vaping Use: Never used  Substance and Sexual Activity   Alcohol use: No   Drug use: No   Sexual activity: Not Currently  Other Topics Concern   Not on file  Social History Narrative   Not on file   Social Determinants of Health   Financial Resource Strain: Not on file  Food Insecurity: Not on file  Transportation Needs: Not on file  Physical Activity: Not on file  Stress: Not on file  Social Connections: Not on file  Intimate Partner Violence: Not on file    Family History  Problem Relation Age of Onset   Cancer Sister    Breast cancer Sister    Cancer Sister 53       uterine   Cancer Sister         colon cancer     Current Outpatient Medications:    acetaminophen (TYLENOL) 500 MG tablet, Take 500 mg by mouth every 6 (six) hours as needed (for pain)., Disp: , Rfl:    alendronate (FOSAMAX) 70 MG tablet, TAKE 1 TABLET BY MOUTH WEEKLY  TAKE WITH A FULL GLASS OF WATER  ON AN EMPTY STOMACH, Disp: 12 tablet, Rfl: 3   amLODipine (NORVASC) 10 MG tablet, Take 1 tablet by mouth daily., Disp: , Rfl:    calcium carbonate (OS-CAL) 600 MG TABS tablet, 600 mg daily with breakfast., Disp: , Rfl:    cholecalciferol (VITAMIN D3) 25 MCG (1000 UNIT) tablet, Take 1,000 Units by mouth every evening., Disp: , Rfl:    citalopram (CELEXA) 10 MG tablet, Take 10 mg by mouth every evening., Disp: , Rfl:    KLOR-CON M20 20 MEQ tablet, Take 20 mEq by mouth 2 (two) times daily., Disp: , Rfl:    levothyroxine (SYNTHROID,  LEVOTHROID) 50 MCG tablet, Take 25-50 mcg by mouth See admin instructions. Take 0.5 tablet (25 mcg) by mouth every other day alternating with taking 1 tablet (50 mcg) by mouth every other day in the morning before breakfast, Disp: , Rfl:    Multiple Vitamin (MULTIVITAMIN WITH MINERALS) TABS tablet, Take 1 tablet by mouth every evening., Disp: , Rfl:    pantoprazole (PROTONIX) 40 MG tablet, Take 1 tablet by mouth daily., Disp: , Rfl:    simvastatin (ZOCOR) 20 MG tablet, Take 1 tablet by mouth daily., Disp: , Rfl:    tamoxifen (NOLVADEX) 20 MG tablet, TAKE 1 TABLET BY MOUTH DAILY, Disp: 80 tablet, Rfl: 3   triamterene-hydrochlorothiazide (MAXZIDE) 75-50 MG per tablet, Take 0.5 tablets by mouth daily., Disp: , Rfl:    Glucosamine-Chondroitin (COSAMIN DS PO), Take 1 tablet by mouth in the morning and at bedtime., Disp: , Rfl:   Physical exam:  Vitals:   12/14/22 1348  BP: 138/81  Pulse: 91  Temp: 99.1 F (37.3 C)  TempSrc: Tympanic  SpO2: 94%  Weight: 161 lb 12.8 oz (73.4 kg)  Height: 5\' 1"  (1.549 m)   Physical Exam Cardiovascular:     Rate and Rhythm: Normal rate and regular rhythm.      Heart sounds: Normal heart sounds.  Pulmonary:     Effort: Pulmonary effort is normal.     Breath sounds: Normal breath sounds.  Abdominal:     General: Bowel sounds are normal.     Palpations: Abdomen is soft.  Skin:    General: Skin is warm and dry.  Neurological:     Mental Status: She is alert and oriented to person, place, and time.   Breast exam: Patient is s/p left mastectomy with reconstruction.  No evidence of chest wall recurrence.  No palpable left axillary adenopathy.  No palpable masses in the right breast right axillary adenopathy.     Latest Ref Rng & Units 06/09/2021   10:41 AM  CMP  Glucose 70 - 99 mg/dL 147   BUN 8 - 23 mg/dL 14   Creatinine 8.29 - 1.00 mg/dL 5.62   Sodium 130 - 865 mmol/L 136   Potassium 3.5 - 5.1 mmol/L 4.1   Chloride 98 - 111 mmol/L 99   CO2 22 - 32 mmol/L 27   Calcium 8.9 - 10.3 mg/dL 9.6   Total Protein 6.5 - 8.1 g/dL 7.7   Total Bilirubin 0.3 - 1.2 mg/dL 0.6   Alkaline Phos 38 - 126 U/L 44   AST 15 - 41 U/L 20   ALT 0 - 44 U/L 16       Latest Ref Rng & Units 06/09/2021   10:41 AM  CBC  WBC 4.0 - 10.5 K/uL 6.2   Hemoglobin 12.0 - 15.0 g/dL 78.4   Hematocrit 69.6 - 46.0 % 41.8   Platelets 150 - 400 K/uL 233      Assessment and plan- Patient is a 77 y.o. female with history of left breast cancer status postmastectomy and reconstruction in 1987 followed by stage I ER/PR positive HER2 negative right breast cancerIn February 2022 status postlumpectomy and adjuvant radiation therapy currently on tamoxifen.  She is here for routine follow-up  Patient is tolerating tamoxifen well and will continue it for 5 years ending in 2027.  Clinically she is doing well with no concerning signs and symptoms of recurrence based on today's exam.  I will see her back in 6 months no labs and schedule her right  breast mammogram for January 2025.  She has baseline osteoporosis for which she is on weekly Fosamax and her bone density scan in May 2024 showed overall  stable findings   Visit Diagnosis 1. Encounter for follow-up surveillance of breast cancer   2. Encounter for monitoring tamoxifen therapy   3. Osteopenia of left forearm      Dr. Owens Shark, MD, MPH [facility] at [facility] [phone] 12/14/2022 4:13 PM

## 2022-12-16 ENCOUNTER — Other Ambulatory Visit: Payer: Self-pay | Admitting: *Deleted

## 2022-12-16 DIAGNOSIS — C50911 Malignant neoplasm of unspecified site of right female breast: Secondary | ICD-10-CM

## 2022-12-16 DIAGNOSIS — Z5181 Encounter for therapeutic drug level monitoring: Secondary | ICD-10-CM

## 2022-12-17 ENCOUNTER — Other Ambulatory Visit: Payer: Self-pay | Admitting: *Deleted

## 2022-12-17 DIAGNOSIS — C50911 Malignant neoplasm of unspecified site of right female breast: Secondary | ICD-10-CM

## 2023-01-24 LAB — EXTERNAL GENERIC LAB PROCEDURE: COLOGUARD: NEGATIVE

## 2023-01-24 LAB — COLOGUARD: COLOGUARD: NEGATIVE

## 2023-06-10 ENCOUNTER — Ambulatory Visit
Admission: RE | Admit: 2023-06-10 | Discharge: 2023-06-10 | Disposition: A | Discharge status: Home / Self Care | Payer: Medicare Other | Source: Ambulatory Visit | Attending: Oncology

## 2023-06-10 ENCOUNTER — Ambulatory Visit
Admission: RE | Admit: 2023-06-10 | Discharge: 2023-06-10 | Disposition: A | Discharge status: Home / Self Care | Payer: Medicare Other | Source: Ambulatory Visit | Attending: Oncology | Admitting: Oncology

## 2023-06-10 DIAGNOSIS — Z79810 Long term (current) use of selective estrogen receptor modulators (SERMs): Secondary | ICD-10-CM | POA: Insufficient documentation

## 2023-06-10 DIAGNOSIS — C50911 Malignant neoplasm of unspecified site of right female breast: Secondary | ICD-10-CM | POA: Insufficient documentation

## 2023-06-10 DIAGNOSIS — C50912 Malignant neoplasm of unspecified site of left female breast: Secondary | ICD-10-CM | POA: Diagnosis not present

## 2023-06-10 DIAGNOSIS — Z9012 Acquired absence of left breast and nipple: Secondary | ICD-10-CM | POA: Diagnosis not present

## 2023-06-10 DIAGNOSIS — C50411 Malignant neoplasm of upper-outer quadrant of right female breast: Secondary | ICD-10-CM | POA: Diagnosis not present

## 2023-06-10 DIAGNOSIS — Z923 Personal history of irradiation: Secondary | ICD-10-CM | POA: Insufficient documentation

## 2023-06-10 HISTORY — DX: Personal history of irradiation: Z92.3

## 2023-06-17 ENCOUNTER — Inpatient Hospital Stay: Admitting: Oncology

## 2023-06-26 ENCOUNTER — Other Ambulatory Visit: Payer: Self-pay | Admitting: Oncology

## 2023-07-05 ENCOUNTER — Encounter: Payer: Self-pay | Admitting: Oncology

## 2023-07-05 ENCOUNTER — Inpatient Hospital Stay: Attending: Oncology | Admitting: Oncology

## 2023-07-05 VITALS — BP 134/75 | HR 90 | Temp 98.5°F | Resp 18 | Wt 156.0 lb

## 2023-07-05 DIAGNOSIS — Z87891 Personal history of nicotine dependence: Secondary | ICD-10-CM | POA: Insufficient documentation

## 2023-07-05 DIAGNOSIS — C50411 Malignant neoplasm of upper-outer quadrant of right female breast: Secondary | ICD-10-CM | POA: Diagnosis present

## 2023-07-05 DIAGNOSIS — Z8049 Family history of malignant neoplasm of other genital organs: Secondary | ICD-10-CM | POA: Diagnosis not present

## 2023-07-05 DIAGNOSIS — Z170 Estrogen receptor positive status [ER+]: Secondary | ICD-10-CM | POA: Insufficient documentation

## 2023-07-05 DIAGNOSIS — Z79810 Long term (current) use of selective estrogen receptor modulators (SERMs): Secondary | ICD-10-CM | POA: Insufficient documentation

## 2023-07-05 DIAGNOSIS — M810 Age-related osteoporosis without current pathological fracture: Secondary | ICD-10-CM | POA: Diagnosis not present

## 2023-07-05 DIAGNOSIS — Z08 Encounter for follow-up examination after completed treatment for malignant neoplasm: Secondary | ICD-10-CM

## 2023-07-05 DIAGNOSIS — Z800 Family history of malignant neoplasm of digestive organs: Secondary | ICD-10-CM | POA: Diagnosis not present

## 2023-07-05 DIAGNOSIS — Z923 Personal history of irradiation: Secondary | ICD-10-CM | POA: Diagnosis not present

## 2023-07-05 DIAGNOSIS — Z5181 Encounter for therapeutic drug level monitoring: Secondary | ICD-10-CM

## 2023-07-05 DIAGNOSIS — Z803 Family history of malignant neoplasm of breast: Secondary | ICD-10-CM | POA: Diagnosis not present

## 2023-07-05 DIAGNOSIS — Z79899 Other long term (current) drug therapy: Secondary | ICD-10-CM

## 2023-07-05 DIAGNOSIS — Z1721 Progesterone receptor positive status: Secondary | ICD-10-CM | POA: Insufficient documentation

## 2023-07-05 DIAGNOSIS — Z1732 Human epidermal growth factor receptor 2 negative status: Secondary | ICD-10-CM | POA: Diagnosis not present

## 2023-07-05 NOTE — Progress Notes (Signed)
Hematology/Oncology Consult note [facility]  Telephone:([phone] [phone]  Patient Care Team: Jerl Mina, MD as PCP - General (Family Medicine) Lemar Livings, Merrily Pew, MD (General Surgery) Jerl Mina, MD as Referring Physician (Family Medicine) Jerl Mina, MD as Referring Physician (Family Medicine) Carmina Miller, MD as Referring Physician (Radiation Oncology) Creig Hines, MD as Consulting Physician (Oncology)   Name of the patient: Erin Nolan  [id number]  [birth year]   Date of visit: 07/05/23  Diagnosis-history of right breast cancer stage I  Chief complaint/ Reason for visit-routine follow-up of breast cancer on tamoxifen  Heme/Onc history: Patient is a 78 year old female with a prior history of left breast cancer s/p left mastectomy with reconstruction in 1997.  She was noted to have an abnormal mammogram in February 2022which showed a hypoechoic mass in the right breast measuring 1.3 x 1 x 0.9 cm at the 10 o'clock position 5 cm from the nipple.  No sonographic correlate for the secondary of distortion.  Simple cyst of 0.5 cm at 9 o'clock position.  Normal-appearing right axillary lymph nodes.  Patient underwent biopsy of the primary breast mass which showed invasive mammary carcinoma 7 mm grade 2.  The other mass without sonographic correlate was also biopsied and was consistent with grade 1 invasive mammary carcinoma.  ER/PR positive and HER-2 negative    Patient underwent Lumpectomy by Dr. Claudine Mouton.  Final pathology showed multifocal invasive mammary carcinoma 11 mm with negative margins.  1 out of 2 sentinel lymph nodes was positive for malignancy.  PT1CPN1A Patient has baseline osteoporosis for which she is on Fosamax and was switched from Arimidex to tamoxifen which was started sometime in April 2022  Interval history-she is doing well for her age and no recent hospitalizations or falls.  She is tolerating tamoxifen well  without any significant side effects  ECOG PS- 2 Pain scale- 0   Review of systems- Review of Systems  Constitutional:  Negative for chills, fever, malaise/fatigue and weight loss.  HENT:  Negative for congestion, ear discharge and nosebleeds.   Eyes:  Negative for blurred vision.  Respiratory:  Negative for cough, hemoptysis, sputum production, shortness of breath and wheezing.   Cardiovascular:  Negative for chest pain, palpitations, orthopnea and claudication.  Gastrointestinal:  Negative for abdominal pain, blood in stool, constipation, diarrhea, heartburn, melena, nausea and vomiting.  Genitourinary:  Negative for dysuria, flank pain, frequency, hematuria and urgency.  Musculoskeletal:  Negative for back pain, joint pain and myalgias.  Skin:  Negative for rash.  Neurological:  Negative for dizziness, tingling, focal weakness, seizures, weakness and headaches.  Endo/Heme/Allergies:  Does not bruise/bleed easily.  Psychiatric/Behavioral:  Negative for depression and suicidal ideas. The patient does not have insomnia.       Allergies  Allergen Reactions  . Neosporin [Neomycin-Bacitracin Zn-Polymyx] Itching     Past Medical History:  Diagnosis Date  . Allergy   . Anxiety   . Breast cancer (HCC) 1987  . Breast cancer (HCC) 2022  . Cancer (HCC) 12/13/1985   LCIS, simple mastectomy, 3 cm area of LCIS, immediate reconstruction.  Marland Kitchen GERD (gastroesophageal reflux disease)   . Hyperlipidemia   . Hypertension   . Hypothyroidism   . Personal history of colonic polyps    2013: Sessile adenoma of ascending colon, Tubular adenoma at 25 cm; 2008: Adenomatous polyp at 30 cm and rectum.1994: Adenomatous polyp at 30 cm;  1992: Adenomatous polyp at 25 cm  . Personal history of radiation therapy   .  Personal history of tobacco use, presenting hazards to health   . Proctitis 2008  . Shingles 2012  . Special screening for malignant neoplasms, colon   . Thyroid disease 2005    hypothyroidism     Past Surgical History:  Procedure Laterality Date  . BREAST BIOPSY Right 07/03/2020   [place] BX. q clip,[facility]  . BREAST BIOPSY Right 07/03/2020   Stereo Bx, x-clip, [facility]  . BREAST EXCISIONAL BIOPSY Right 1988   Fibroadenoma  . BREAST LUMPECTOMY,RADIO FREQ LOCALIZER,AXILLARY SENTINEL LYMPH NODE BIOPSY Right 07/18/2020   Procedure: BREAST LUMPECTOMY,RADIO FREQ LOCALIZER,AXILLARY SENTINEL LYMPH NODE BIOPSY;  Surgeon: Campbell Lerner, MD;  Location: [facility];  Service: General;  Laterality: Right;  . BREAST RECONSTRUCTION Left   . BREAST SURGERY Left 1987   mastectomy  . CATARACT EXTRACTION    . COLONOSCOPY  2012   hx of colon polyps, Dr. Lemar Livings  . COLONOSCOPY WITH PROPOFOL N/A 11/24/2016   Procedure: COLONOSCOPY WITH PROPOFOL;  Surgeon: [name], MD;  Location: [facility];  Service: Endoscopy;  Laterality: N/A;  . MASTECTOMY Left 1987   breast ca  . TUBAL LIGATION      Social History   Socioeconomic History  . Marital status: Married    Spouse name: Not on file  . Number of children: Not on file  . Years of education: Not on file  . Highest education level: Not on file  Occupational History  . Not on file  Tobacco Use  . Smoking status: Former    Current packs/day: 1.00    Average packs/day: 1 pack/day for 42.0 years (42.0 ttl pk-yrs)    Types: Cigarettes  . Smokeless tobacco: Never  Vaping Use  . Vaping status: Never Used  Substance and Sexual Activity  . Alcohol use: No  . Drug use: No  . Sexual activity: Not Currently  Other Topics Concern  . Not on file  Social History Narrative  . Not on file   Social Drivers of Health   Financial Resource Strain: Low Risk  (01/05/2023)   Received from [facility]   Overall Financial Resource Strain (CARDIA)   . Difficulty of Paying Living Expenses: Not hard at all  Food Insecurity: No Food Insecurity (01/05/2023)   Received from [facility]   Hunger Vital Sign    . Worried About Programme researcher, broadcasting/film/video in the Last Year: Never true   . Ran Out of Food in the Last Year: Never true  Transportation Needs: No Transportation Needs (01/05/2023)   Received from [facility]   [facility] - Transportation   . In the past 12 months, has lack of transportation kept you from medical appointments or from getting medications?: No   . Lack of Transportation (Non-Medical): No  Physical Activity: Not on file  Stress: Not on file  Social Connections: Not on file  Intimate Partner Violence: Not on file    Family History  Problem Relation Age of Onset  . Cancer Sister   . Breast cancer Sister   . Cancer Sister 7       uterine  . Cancer Sister        colon cancer     Current Outpatient Medications:  .  acetaminophen (TYLENOL) 500 MG tablet, Take 500 mg by mouth every 6 (six) hours as needed (for pain)., Disp: , Rfl:  .  alendronate (FOSAMAX) 70 MG tablet, TAKE 1 TABLET BY MOUTH WEEKLY  TAKE WITH A FULL GLASS OF  WATER  ON AN EMPTY STOMACH, Disp: 12 tablet, Rfl: 3 .  amLODipine (NORVASC) 10 MG tablet, Take 1 tablet by mouth daily., Disp: , Rfl:  .  calcium carbonate (OS-CAL) 600 MG TABS tablet, 600 mg daily with breakfast., Disp: , Rfl:  .  cholecalciferol (VITAMIN D3) 25 MCG (1000 UNIT) tablet, Take 1,000 Units by mouth every evening., Disp: , Rfl:  .  citalopram (CELEXA) 10 MG tablet, Take 10 mg by mouth every evening., Disp: , Rfl:  .  Glucosamine-Chondroitin (COSAMIN DS PO), Take 1 tablet by mouth in the morning and at bedtime., Disp: , Rfl:  .  KLOR-CON M20 20 MEQ tablet, Take 20 mEq by mouth 2 (two) times daily., Disp: , Rfl:  .  levothyroxine (SYNTHROID, LEVOTHROID) 50 MCG tablet, Take 25-50 mcg by mouth See admin instructions. Take 0.5 tablet (25 mcg) by mouth every other day alternating with taking 1 tablet (50 mcg) by mouth every other day in the morning before breakfast, Disp: , Rfl:  .  Multiple Vitamin (MULTIVITAMIN WITH MINERALS) TABS  tablet, Take 1 tablet by mouth every evening., Disp: , Rfl:  .  pantoprazole (PROTONIX) 40 MG tablet, Take 1 tablet by mouth daily., Disp: , Rfl:  .  simvastatin (ZOCOR) 20 MG tablet, Take 1 tablet by mouth daily., Disp: , Rfl:  .  tamoxifen (NOLVADEX) 20 MG tablet, TAKE 1 TABLET BY MOUTH DAILY, Disp: 80 tablet, Rfl: 3 .  triamterene-hydrochlorothiazide (MAXZIDE) 75-50 MG per tablet, Take 0.5 tablets by mouth daily., Disp: , Rfl:   Physical exam:  Vitals:   07/05/23 1418  BP: 134/75  Pulse: 90  Resp: 18  Temp: 98.5 F (36.9 C)  TempSrc: Temporal  SpO2: 98%  Weight: 156 lb (70.8 kg)   Physical Exam Constitutional:      Comments: Ambulates with a walker  Cardiovascular:     Rate and Rhythm: Normal rate and regular rhythm.     Heart sounds: Normal heart sounds.  Pulmonary:     Effort: Pulmonary effort is normal.     Breath sounds: Normal breath sounds.  Abdominal:     General: Bowel sounds are normal.     Palpations: Abdomen is soft.  Skin:    General: Skin is warm and dry.  Neurological:     Mental Status: She is alert and oriented to person, place, and time.        Latest Ref Rng & Units 06/09/2021   10:41 AM  CMP  Glucose 70 - 99 mg/dL 413   BUN 8 - 23 mg/dL 14   Creatinine 2.44 - 1.00 mg/dL 0.10   Sodium 272 - 536 mmol/L 136   Potassium 3.5 - 5.1 mmol/L 4.1   Chloride 98 - 111 mmol/L 99   CO2 22 - 32 mmol/L 27   Calcium 8.9 - 10.3 mg/dL 9.6   Total Protein 6.5 - 8.1 g/dL 7.7   Total Bilirubin 0.3 - 1.2 mg/dL 0.6   Alkaline Phos 38 - 126 U/L 44   AST 15 - 41 U/L 20   ALT 0 - 44 U/L 16       Latest Ref Rng & Units 06/09/2021   10:41 AM  CBC  WBC 4.0 - 10.5 K/uL 6.2   Hemoglobin 12.0 - 15.0 g/dL 64.4   Hematocrit 03.4 - 46.0 % 41.8   Platelets 150 - 400 K/uL 233     No images are attached to the encounter.  MM DIAG BREAST TOMO UNI RIGHT Result Date:  06/10/2023 CLINICAL DATA:  78 year old female presenting for routine annual surveillance status post right  breast lumpectomy in February of 2022. She underwent adjuvant radiation therapy and is currently on tamoxifen. She has had a prior left breast mastectomy for cancer. EXAM: DIGITAL DIAGNOSTIC UNILATERAL RIGHT MAMMOGRAM WITH TOMOSYNTHESIS AND CAD TECHNIQUE: Right digital diagnostic mammography and breast tomosynthesis was performed. The images were evaluated with computer-aided detection. COMPARISON:  Previous exam(s). ACR Breast Density Category b: There are scattered areas of fibroglandular density. FINDINGS: Stable postlumpectomy changes in the upper-outer right breast. No new suspicious mass, calcification, or other findings are identified in the right breast. Patient is status post left mastectomy. IMPRESSION: Stable postlumpectomy changes in the right breast. No evidence of right breast malignancy. RECOMMENDATION: Per protocol, as the patient is now 2 or more years status post lumpectomy, she may return to annual screening mammography in 1 year. However, given the history of breast cancer, the patient remains eligible for annual diagnostic mammography if preferred. I have discussed the findings and recommendations with the patient. If applicable, a reminder letter will be sent to the patient regarding the next appointment. BI-RADS CATEGORY  2: Benign. Electronically Signed   By: Jacob Moores M.D.   On: 06/10/2023 14:18     Assessment and plan- Patient is a 78 y.o. female with history of left breast cancer status postmastectomy and reconstruction in 1987 followed by stage I ER/PR positive HER2 negative right breast cancerIn February 2022 status postlumpectomy and adjuvant radiation therapy currently on tamoxifen. She is here for routine f/u  Clinically patient is doing well with no concern Signs and symptoms of recurrence based on today's exam.  Her mammogram from 06/10/2023 was unremarkable.  She will stay on tamoxifen given her baseline history of osteoporosis and will continue to take it for 5 years  ending in 2027.  I will see her back in 6 months no labs   Visit Diagnosis 1. Encounter for follow-up surveillance of breast cancer   2. Encounter for monitoring tamoxifen therapy   3. High risk medication use      Dr. Owens Shark, MD, MPH [facility] at [facility] [phone] 07/05/2023 3:00 PM

## 2023-07-13 ENCOUNTER — Other Ambulatory Visit: Payer: Self-pay | Admitting: Oncology

## 2023-07-13 ENCOUNTER — Telehealth: Payer: Self-pay | Admitting: *Deleted

## 2023-07-13 ENCOUNTER — Other Ambulatory Visit: Payer: Self-pay

## 2023-07-13 MED ORDER — TAMOXIFEN CITRATE 20 MG PO TABS: 20.0000 mg | ORAL_TABLET | Freq: Every day | ORAL | 3 refills | Status: DC

## 2023-07-13 NOTE — Telephone Encounter (Signed)
 Pt wanted a refill of tamoxifen  but they sent it to wrong pharmacy. We cancelled the order [facility]pt states that is mails her the meds for free.

## 2023-09-23 ENCOUNTER — Telehealth: Payer: Self-pay | Admitting: *Deleted

## 2023-09-23 NOTE — Telephone Encounter (Signed)
 She needs a new prosthesis  Left breast. It is leaking. I filled out form to get the prosthesis and Melanee signed and sent her last visit notes and the fax went through. I called the patient and lt her her know that I did put in for her prosthesis and she can call [facility] to see what they can do for  request

## 2023-09-26 ENCOUNTER — Other Ambulatory Visit: Payer: Self-pay | Admitting: Oncology

## 2023-10-24 ENCOUNTER — Ambulatory Visit: Admitting: Radiation Oncology

## 2024-01-02 ENCOUNTER — Ambulatory Visit: Admitting: Oncology

## 2024-01-04 ENCOUNTER — Ambulatory Visit: Admitting: Oncology

## 2024-01-13 ENCOUNTER — Inpatient Hospital Stay: Attending: Oncology | Admitting: Oncology

## 2024-01-13 ENCOUNTER — Encounter: Payer: Self-pay | Admitting: Oncology

## 2024-01-13 VITALS — BP 138/82 | HR 88 | Temp 99.6°F | Resp 18 | Ht 61.0 in | Wt 152.9 lb

## 2024-01-13 DIAGNOSIS — Z08 Encounter for follow-up examination after completed treatment for malignant neoplasm: Secondary | ICD-10-CM

## 2024-01-13 DIAGNOSIS — Z800 Family history of malignant neoplasm of digestive organs: Secondary | ICD-10-CM | POA: Insufficient documentation

## 2024-01-13 DIAGNOSIS — Z923 Personal history of irradiation: Secondary | ICD-10-CM | POA: Diagnosis not present

## 2024-01-13 DIAGNOSIS — Z803 Family history of malignant neoplasm of breast: Secondary | ICD-10-CM | POA: Insufficient documentation

## 2024-01-13 DIAGNOSIS — Z79810 Long term (current) use of selective estrogen receptor modulators (SERMs): Secondary | ICD-10-CM | POA: Diagnosis not present

## 2024-01-13 DIAGNOSIS — Z1721 Progesterone receptor positive status: Secondary | ICD-10-CM | POA: Insufficient documentation

## 2024-01-13 DIAGNOSIS — Z7983 Long term (current) use of bisphosphonates: Secondary | ICD-10-CM

## 2024-01-13 DIAGNOSIS — Z87891 Personal history of nicotine dependence: Secondary | ICD-10-CM | POA: Diagnosis not present

## 2024-01-13 DIAGNOSIS — Z79899 Other long term (current) drug therapy: Secondary | ICD-10-CM

## 2024-01-13 DIAGNOSIS — Z1732 Human epidermal growth factor receptor 2 negative status: Secondary | ICD-10-CM | POA: Diagnosis not present

## 2024-01-13 DIAGNOSIS — Z8049 Family history of malignant neoplasm of other genital organs: Secondary | ICD-10-CM | POA: Insufficient documentation

## 2024-01-13 DIAGNOSIS — Z5181 Encounter for therapeutic drug level monitoring: Secondary | ICD-10-CM

## 2024-01-13 DIAGNOSIS — Z170 Estrogen receptor positive status [ER+]: Secondary | ICD-10-CM | POA: Diagnosis not present

## 2024-01-13 DIAGNOSIS — M810 Age-related osteoporosis without current pathological fracture: Secondary | ICD-10-CM | POA: Insufficient documentation

## 2024-01-13 DIAGNOSIS — C50411 Malignant neoplasm of upper-outer quadrant of right female breast: Secondary | ICD-10-CM | POA: Insufficient documentation

## 2024-01-13 NOTE — Progress Notes (Signed)
 No concerns today

## 2024-01-13 NOTE — Progress Notes (Signed)
 Hematology/Oncology Consult note [facility]  [phone] [phone]  Patient Care Team: Valora Agent, MD as PCP - General (Family Medicine) Dessa, Reyes ORN, MD (General Surgery) Valora Agent, MD as Referring Physician (Family Medicine) Valora Agent, MD as Referring Physician (Family Medicine) Lenn Aran, MD as Referring Physician (Radiation Oncology) Melanee Annah BROCKS, MD as Consulting Physician (Oncology)   Name of the patient: Erin Nolan  [id number]  01/07/1946   Date of visit: 01/13/24  Diagnosis- history of right breast cancer stage I    Chief complaint/ Reason for visit-routine follow up of breast cancer  Heme/Onc history: Patient is a 78 year old female with a prior history of left breast cancer s/p left mastectomy with reconstruction in 1997.  She was noted to have an abnormal mammogram in February 2022which showed a hypoechoic mass in the right breast measuring 1.3 x 1 x 0.9 cm at the 10 o'clock position 5 cm from the nipple.  No sonographic correlate for the secondary of distortion.  Simple cyst of 0.5 cm at 9 o'clock position.  Normal-appearing right axillary lymph nodes.  Patient underwent biopsy of the primary breast mass which showed invasive mammary carcinoma 7 mm grade 2.  The other mass without sonographic correlate was also biopsied and was consistent with grade 1 invasive mammary carcinoma.  ER/PR positive and HER-2 negative    Patient underwent Lumpectomy by Dr. Lane.  Final pathology showed multifocal invasive mammary carcinoma 11 mm with negative margins.  1 out of 2 sentinel lymph nodes was positive for malignancy.  PT1CPN1A Patient has baseline osteoporosis for which she is on Fosamax  and was switched from Arimidex  to tamoxifen  which was started sometime in April 2022  Interval history-tolerating tamoxifen  well without any significant side effects.  Denies any breast concerns today.  Appetite and  weight have remained stable.  Denies any new aches or pains anywhere  ECOG PS- 2 Pain scale- 0   Review of systems- Review of Systems  Constitutional:  Negative for chills, fever, malaise/fatigue and weight loss.  HENT:  Negative for congestion, ear discharge and nosebleeds.   Eyes:  Negative for blurred vision.  Respiratory:  Negative for cough, hemoptysis, sputum production, shortness of breath and wheezing.   Cardiovascular:  Negative for chest pain, palpitations, orthopnea and claudication.  Gastrointestinal:  Negative for abdominal pain, blood in stool, constipation, diarrhea, heartburn, melena, nausea and vomiting.  Genitourinary:  Negative for dysuria, flank pain, frequency, hematuria and urgency.  Musculoskeletal:  Negative for back pain, joint pain and myalgias.  Skin:  Negative for rash.  Neurological:  Negative for dizziness, tingling, focal weakness, seizures, weakness and headaches.  Endo/Heme/Allergies:  Does not bruise/bleed easily.  Psychiatric/Behavioral:  Negative for depression and suicidal ideas. The patient does not have insomnia.       Allergies  Allergen Reactions   Neosporin [Neomycin-Bacitracin Zn-Polymyx] Itching     Past Medical History:  Diagnosis Date   Allergy    Anxiety    Breast cancer (HCC) 1987   Breast cancer (HCC) 2022   Cancer (HCC) 12/13/1985   LCIS, simple mastectomy, 3 cm area of LCIS, immediate reconstruction.   GERD (gastroesophageal reflux disease)    Hyperlipidemia    Hypertension    Hypothyroidism    Personal history of colonic polyps    2013: Sessile adenoma of ascending colon, Tubular adenoma at 25 cm; 2008: Adenomatous polyp at 30 cm and rectum.1994: Adenomatous polyp at 30 cm;  1992: Adenomatous polyp at 25 cm  Personal history of radiation therapy    Personal history of tobacco use, presenting hazards to health    Proctitis 2008   Shingles 2012   Special screening for malignant neoplasms, colon    Thyroid disease 2005    hypothyroidism     Past Surgical History:  Procedure Laterality Date   BREAST BIOPSY Right 07/03/2020   US  BX. q clip,[facility]   BREAST BIOPSY Right 07/03/2020   Stereo Bx, x-clip, [facility]   BREAST EXCISIONAL BIOPSY Right 1988   Fibroadenoma   BREAST LUMPECTOMY,RADIO FREQ LOCALIZER,AXILLARY SENTINEL LYMPH NODE BIOPSY Right 07/18/2020   Procedure: BREAST LUMPECTOMY,RADIO FREQ LOCALIZER,AXILLARY SENTINEL LYMPH NODE BIOPSY;  Surgeon: Lane Shope, MD;  Location: [facility] OR[facility];  Service: General;  Laterality: Right;   BREAST RECONSTRUCTION Left    BREAST SURGERY Left 1987   mastectomy   CATARACT EXTRACTION     COLONOSCOPY  2012   hx of colon polyps, Dr. Dessa   COLONOSCOPY WITH PROPOFOL  N/A 11/24/2016   Procedure: COLONOSCOPY WITH PROPOFOL ;  Surgeon: Dessa Reyes ORN, MD;  Location: [facility];  Service: Endoscopy;  Laterality: N/A;   MASTECTOMY Left 1987   breast ca   TUBAL LIGATION      Social History   Socioeconomic History   Marital status: Married    Spouse name: Not on file   Number of children: Not on file   Years of education: Not on file   Highest education level: Not on file  Occupational History   Not on file  Tobacco Use   Smoking status: Former    Current packs/day: 1.00    Average packs/day: 1 pack/day for 42.0 years (42.0 ttl pk-yrs)    Types: Cigarettes   Smokeless tobacco: Never  Vaping Use   Vaping status: Never Used  Substance and Sexual Activity   Alcohol use: No   Drug use: No   Sexual activity: Not Currently  Other Topics Concern   Not on file  Social History Narrative   Not on file   Social Drivers of Health   Financial Resource Strain: Low Risk  (01/06/2024)   Received from [facility]   Overall Financial Resource Strain (CARDIA)    Difficulty of Paying Living Expenses: Not hard at all  Food Insecurity: No Food Insecurity (01/06/2024)   Received from [facility]   Hunger Vital Sign    Within the past 12  months, you worried that your food would run out before you got the money to buy more.: Never true    Within the past 12 months, the food you bought just didn't last and you didn't have money to get more.: Never true  Transportation Needs: No Transportation Needs (01/06/2024)   Received from [facility]    In the past 12 months, has lack of transportation kept you from medical appointments or from getting medications?: No    Lack of Transportation (Non-Medical): No  Physical Activity: Not on file  Stress: Not on file  Social Connections: Not on file  Intimate Partner Violence: Not on file    Family History  Problem Relation Age of Onset   Cancer Sister    Breast cancer Sister    Cancer Sister 57       uterine   Cancer Sister        colon cancer     Current Outpatient Medications:    alendronate  (FOSAMAX ) 70 MG tablet, TAKE 1 TABLET BY MOUTH WEEKLY  TAKE WITH A FULL GLASS OF WATER  ON AN EMPTY STOMACH, Disp: 12 tablet, Rfl: 3   amLODipine (NORVASC) 10 MG tablet, Take 1 tablet by mouth daily., Disp: , Rfl:    calcium carbonate (OS-CAL) 600 MG TABS tablet, 600 mg daily with breakfast., Disp: , Rfl:    cholecalciferol (VITAMIN D3) 25 MCG (1000 UNIT) tablet, Take 1,000 Units by mouth every evening., Disp: , Rfl:    citalopram (CELEXA) 10 MG tablet, Take 10 mg by mouth every evening., Disp: , Rfl:    KLOR-CON M20 20 MEQ tablet, Take 20 mEq by mouth 2 (two) times daily., Disp: , Rfl:    levothyroxine (SYNTHROID, LEVOTHROID) 50 MCG tablet, Take 25-50 mcg by mouth See admin instructions. Take 0.5 tablet (25 mcg) by mouth every other day alternating with taking 1 tablet (50 mcg) by mouth every other day in the morning before breakfast, Disp: , Rfl:    Multiple Vitamin (MULTIVITAMIN WITH MINERALS) TABS tablet, Take 1 tablet by mouth every evening., Disp: , Rfl:    pantoprazole (PROTONIX) 40 MG tablet, Take 1 tablet by mouth daily., Disp: , Rfl:     simvastatin (ZOCOR) 20 MG tablet, Take 1 tablet by mouth daily., Disp: , Rfl:    tamoxifen  (NOLVADEX ) 20 MG tablet, Take 1 tablet (20 mg total) by mouth daily., Disp: 90 tablet, Rfl: 3   triamterene-hydrochlorothiazide (MAXZIDE) 75-50 MG per tablet, Take 0.5 tablets by mouth daily., Disp: , Rfl:   Physical exam:  Vitals:   01/13/24 1300  BP: 138/82  Pulse: 88  Resp: 18  Temp: 99.6 F (37.6 C)  TempSrc: Tympanic  SpO2: 96%  Weight: 152 lb 14.4 oz (69.4 kg)  Height: 5' 1 (1.549 m)   Physical Exam Constitutional:      Comments: Ambulates with the help of a walker.  Appears in no acute distress  Cardiovascular:     Rate and Rhythm: Normal rate and regular rhythm.     Heart sounds: Normal heart sounds.  Pulmonary:     Effort: Pulmonary effort is normal.     Breath sounds: Normal breath sounds.  Abdominal:     General: Bowel sounds are normal.     Palpations: Abdomen is soft.  Skin:    General: Skin is warm and dry.  Neurological:     Mental Status: She is alert and oriented to person, place, and time.      I have personally reviewed labs listed below:    Latest Ref Rng & Units 06/09/2021   10:41 AM  CMP  Glucose 70 - 99 mg/dL 899   BUN 8 - 23 mg/dL 14   Creatinine 9.55 - 1.00 mg/dL 9.18   Sodium 864 - 854 mmol/L 136   Potassium 3.5 - 5.1 mmol/L 4.1   Chloride 98 - 111 mmol/L 99   CO2 22 - 32 mmol/L 27   Calcium 8.9 - 10.3 mg/dL 9.6   Total Protein 6.5 - 8.1 g/dL 7.7   Total Bilirubin 0.3 - 1.2 mg/dL 0.6   Alkaline Phos 38 - 126 U/L 44   AST 15 - 41 U/L 20   ALT 0 - 44 U/L 16       Latest Ref Rng & Units 06/09/2021   10:41 AM  CBC  WBC 4.0 - 10.5 K/uL 6.2   Hemoglobin 12.0 - 15.0 g/dL 86.1   Hematocrit 63.9 - 46.0 % 41.8   Platelets 150 - 400 K/uL 233      Assessment and  plan- Patient is a 78 y.o. female with history of left breast cancer status postmastectomy and reconstruction in 1987 followed by stage I ER/PR positive HER2 negative right breast cancerIn  February 2022 status postlumpectomy and adjuvant radiation.  She is here for routine follow-up of breast cancer presently on tamoxifen   Clinically patient is doing well with no concerning signs and symptoms of recurrence based on today's exam.  She will continue taking tamoxifen  until April 2027.  Patient has baseline osteoporosis and her T-score has worsened -3.5 and therefore I would like her to continue with weekly Fosamax  indefinitely instead of stopping it after 3 to 5 years.I will see her back in 6 months no labs   Visit Diagnosis 1. Encounter for follow-up surveillance of breast cancer   2. Encounter for monitoring tamoxifen  therapy   3. High risk medication use   4. Osteoporosis of forearm   5. Long term (current) use of bisphosphonates      Dr. Annah Skene, MD, MPH [facility] [phone] 01/13/2024 2:23 PM

## 2024-01-17 ENCOUNTER — Other Ambulatory Visit: Payer: Self-pay | Admitting: Oncology

## 2024-01-17 DIAGNOSIS — R928 Other abnormal and inconclusive findings on diagnostic imaging of breast: Secondary | ICD-10-CM

## 2024-01-17 DIAGNOSIS — Z08 Encounter for follow-up examination after completed treatment for malignant neoplasm: Secondary | ICD-10-CM

## 2024-03-14 ENCOUNTER — Other Ambulatory Visit: Payer: Self-pay | Admitting: Oncology

## 2024-06-11 ENCOUNTER — Ambulatory Visit
Admission: RE | Admit: 2024-06-11 | Discharge: 2024-06-11 | Disposition: A | Discharge status: Home / Self Care | Source: Ambulatory Visit | Attending: Oncology | Admitting: Oncology

## 2024-06-11 DIAGNOSIS — Z853 Personal history of malignant neoplasm of breast: Secondary | ICD-10-CM | POA: Diagnosis present

## 2024-06-11 DIAGNOSIS — Z08 Encounter for follow-up examination after completed treatment for malignant neoplasm: Secondary | ICD-10-CM | POA: Diagnosis present

## 2024-07-16 ENCOUNTER — Inpatient Hospital Stay: Admitting: Oncology
